# Patient Record
Sex: Male | Born: 1935 | Race: White | Hispanic: No | Marital: Married | Smoking: Never smoker
Health system: Southern US, Community
[De-identification: ages and names within clinical notes are randomized; demographics above are authoritative.]

## PROBLEM LIST (undated history)

## (undated) DIAGNOSIS — E039 Hypothyroidism, unspecified: Secondary | ICD-10-CM

## (undated) DIAGNOSIS — E785 Hyperlipidemia, unspecified: Secondary | ICD-10-CM

## (undated) DIAGNOSIS — R634 Abnormal weight loss: Secondary | ICD-10-CM

## (undated) DIAGNOSIS — N189 Chronic kidney disease, unspecified: Secondary | ICD-10-CM

## (undated) DIAGNOSIS — I2510 Atherosclerotic heart disease of native coronary artery without angina pectoris: Secondary | ICD-10-CM

## (undated) DIAGNOSIS — Z960 Presence of urogenital implants: Secondary | ICD-10-CM

## (undated) DIAGNOSIS — C801 Malignant (primary) neoplasm, unspecified: Secondary | ICD-10-CM

## (undated) DIAGNOSIS — N400 Enlarged prostate without lower urinary tract symptoms: Secondary | ICD-10-CM

## (undated) DIAGNOSIS — E079 Disorder of thyroid, unspecified: Secondary | ICD-10-CM

## (undated) DIAGNOSIS — I10 Essential (primary) hypertension: Secondary | ICD-10-CM

## (undated) DIAGNOSIS — M1990 Unspecified osteoarthritis, unspecified site: Secondary | ICD-10-CM

## (undated) HISTORY — PX: CORONARY ARTERY BYPASS GRAFT: SHX141

## (undated) HISTORY — PX: OTHER SURGICAL HISTORY: SHX169

## (undated) HISTORY — DX: Benign prostatic hyperplasia without lower urinary tract symptoms: N40.0

## (undated) HISTORY — DX: Hyperlipidemia, unspecified: E78.5

## (undated) HISTORY — DX: Atherosclerotic heart disease of native coronary artery without angina pectoris: I25.10

## (undated) HISTORY — DX: Disorder of thyroid, unspecified: E07.9

---

## 1997-08-20 ENCOUNTER — Emergency Department (HOSPITAL_COMMUNITY): Admission: EM | Admit: 1997-08-20 | Discharge: 1997-08-20 | Payer: Self-pay

## 1998-12-11 ENCOUNTER — Ambulatory Visit (HOSPITAL_COMMUNITY): Admission: RE | Admit: 1998-12-11 | Discharge: 1998-12-11 | Payer: Self-pay | Admitting: Gastroenterology

## 1998-12-11 ENCOUNTER — Encounter (INDEPENDENT_AMBULATORY_CARE_PROVIDER_SITE_OTHER): Payer: Self-pay | Admitting: Specialist

## 2004-10-24 ENCOUNTER — Ambulatory Visit: Payer: Self-pay | Admitting: *Deleted

## 2005-10-30 ENCOUNTER — Ambulatory Visit: Payer: Self-pay | Admitting: *Deleted

## 2005-11-11 ENCOUNTER — Ambulatory Visit: Payer: Self-pay

## 2006-12-03 ENCOUNTER — Ambulatory Visit: Payer: Self-pay | Admitting: Cardiology

## 2007-11-27 ENCOUNTER — Ambulatory Visit: Payer: Self-pay | Admitting: Cardiology

## 2008-09-28 ENCOUNTER — Encounter (INDEPENDENT_AMBULATORY_CARE_PROVIDER_SITE_OTHER): Payer: Self-pay | Admitting: *Deleted

## 2008-12-01 DIAGNOSIS — I2510 Atherosclerotic heart disease of native coronary artery without angina pectoris: Secondary | ICD-10-CM | POA: Insufficient documentation

## 2008-12-01 DIAGNOSIS — E039 Hypothyroidism, unspecified: Secondary | ICD-10-CM | POA: Insufficient documentation

## 2008-12-01 DIAGNOSIS — E785 Hyperlipidemia, unspecified: Secondary | ICD-10-CM

## 2008-12-06 ENCOUNTER — Ambulatory Visit: Payer: Self-pay | Admitting: Cardiology

## 2008-12-06 DIAGNOSIS — I10 Essential (primary) hypertension: Secondary | ICD-10-CM

## 2008-12-13 ENCOUNTER — Telehealth (INDEPENDENT_AMBULATORY_CARE_PROVIDER_SITE_OTHER): Payer: Self-pay | Admitting: *Deleted

## 2008-12-14 ENCOUNTER — Ambulatory Visit: Payer: Self-pay | Admitting: Cardiovascular Disease

## 2008-12-14 ENCOUNTER — Ambulatory Visit: Payer: Self-pay

## 2008-12-14 ENCOUNTER — Encounter (HOSPITAL_COMMUNITY): Admission: RE | Admit: 2008-12-14 | Discharge: 2009-02-08 | Payer: Self-pay | Admitting: Cardiology

## 2008-12-20 ENCOUNTER — Encounter: Admission: RE | Admit: 2008-12-20 | Discharge: 2008-12-20 | Payer: Self-pay | Admitting: Endocrinology

## 2009-02-28 ENCOUNTER — Emergency Department (HOSPITAL_COMMUNITY): Admission: EM | Admit: 2009-02-28 | Discharge: 2009-02-28 | Payer: Self-pay | Admitting: Emergency Medicine

## 2009-03-14 ENCOUNTER — Encounter: Admission: RE | Admit: 2009-03-14 | Discharge: 2009-03-14 | Payer: Self-pay | Admitting: Orthopedic Surgery

## 2009-04-07 ENCOUNTER — Telehealth: Payer: Self-pay | Admitting: Cardiovascular Disease

## 2009-04-11 ENCOUNTER — Telehealth (INDEPENDENT_AMBULATORY_CARE_PROVIDER_SITE_OTHER): Payer: Self-pay | Admitting: *Deleted

## 2009-05-31 ENCOUNTER — Encounter: Admission: RE | Admit: 2009-05-31 | Discharge: 2009-05-31 | Payer: Self-pay | Admitting: Endocrinology

## 2009-12-15 ENCOUNTER — Encounter: Payer: Self-pay | Admitting: Cardiology

## 2009-12-19 ENCOUNTER — Ambulatory Visit: Payer: Self-pay | Admitting: Cardiology

## 2009-12-27 ENCOUNTER — Encounter: Admission: RE | Admit: 2009-12-27 | Discharge: 2009-12-27 | Payer: Self-pay | Admitting: Endocrinology

## 2009-12-27 ENCOUNTER — Telehealth (INDEPENDENT_AMBULATORY_CARE_PROVIDER_SITE_OTHER): Payer: Self-pay | Admitting: *Deleted

## 2010-03-07 ENCOUNTER — Encounter
Admission: RE | Admit: 2010-03-07 | Discharge: 2010-03-07 | Payer: Self-pay | Source: Home / Self Care | Attending: Neurological Surgery | Admitting: Neurological Surgery

## 2010-03-15 NOTE — Progress Notes (Signed)
Summary: ASA   Phone Note Call from Patient Call back at Home Phone [phone]   Caller: Patient Reason for Call: Talk to Nurse Summary of Call: [facility] is taken pt off of ASA for 5 days for steroid injection in hip Initial call taken by: Migdalia Dk,  April 07, 2009 4:46 PM  Follow-up for Phone Call        spoke with pt, he is going to have steroid injection on tues or thurs next week and is off his asa for now. told pt he should restart asa the day he gets the injection. pt voiced understanding Deliah Goody, RN  April 07, 2009 4:52 PM

## 2010-03-15 NOTE — Progress Notes (Signed)
Summary: question regarding asa-injection   Phone Note Call from Patient Call back at Home Phone [phone]   Caller: Patient Reason for Call: Talk to Nurse Details for Reason:  getting injection when 3/3. question regarding asa. Initial call taken by: Lorne Skeens,  April 11, 2009 2:35 PM  Follow-up for Phone Call        Pt needed to know when to restart ASA.  Instructed him to restart after his procedure 3/3.  He states understanding. Follow-up by: Charolotte Capuchin, RN,  April 11, 2009 2:56 PM

## 2010-03-15 NOTE — Assessment & Plan Note (Signed)
Summary: yearly/sl      Allergies Added: NKDA  Visit Type:  Follow-up Primary Provider:  Hali Marry, MD  CC:  CAD.  History of Present Illness: The patient returns for followup of this. Since I last saw him he has had no new cardiovascular complaints. He did have an injury of his back and had this treated with injections. This has limited his activity to some degree. Weight is activities he denies any chest pressure, neck or arm discomfort. He is not having any new shortness of breath, PND or orthopnea. He has had no palpitations, presyncope or syncope.  Current Medications (verified): 1)  Flomax 0.4 Mg Caps (Tamsulosin Hcl) .... Daily 2)  Synthroid 100 Mcg Tabs (Levothyroxine Sodium) .... Daily 3)  Lipitor 10 Mg Tabs (Atorvastatin Calcium) .... Daily 4)  Altace 10 Mg Caps (Ramipril) .Marland Kitchen.. 1 By Mouth Daiyl 5)  Atenolol 25 Mg Tabs (Atenolol) .... Daily 6)  Aspirin 81 Mg  Tabs (Aspirin) .... 2 Daily 7)  Multivitamins   Tabs (Multiple Vitamin) .... Daily  Allergies (verified): No Known Drug Allergies  Past History:  Past Medical History: Reviewed history from 12/01/2008 and no changes required.  1. Coronary artery disease (status post CABG in 1996 by Dr. Donata Clay       with LIMA to the LAD, SVG to D1 and D2, SVG to circumflex, SVG to       right coronary artery).   2. Left inguinal hernia.   3. Hypothyroidism.   4. Dyslipidemia.   Past Surgical History: Reviewed history from 12/01/2008 and no changes required. None  Review of Systems       As stated in the HPI and negative for all other systems.   Vital Signs:  Patient profile:   77 year old male Height:      68 inches Weight:      166 pounds BMI:     25.33 Pulse rate:   85 / minute Resp:     16 per minute BP sitting:   114 / 64  (left arm)  Vitals Entered By: Marrion Coy, CNA (December 19, 2009 4:30 PM)  Physical Exam  General:  Well developed, well nourished, in no acute distress. Head:   normocephalic and atraumatic Mouth:  Teeth, gums and palate normal. Oral mucosa normal. Neck:  Neck supple, no JVD. No masses, thyromegaly or abnormal cervical nodes. Chest Wall:  no deformities or breast masses noted Lungs:  Clear bilaterally to auscultation and percussion. Abdomen:  Bowel sounds positive; abdomen soft and non-tender without masses, organomegaly, or hernias noted. No hepatosplenomegaly. Msk:  Back normal, normal gait. Muscle strength and tone normal. Extremities:  No clubbing or cyanosis. Neurologic:  Alert and oriented x 3. Skin:  Intact without lesions or rashes. Cervical Nodes:  no significant adenopathy Inguinal Nodes:  no significant adenopathy Psych:  Normal affect.   Detailed Cardiovascular Exam  Neck    Carotids: Carotids full and equal bilaterally without bruits.      Neck Veins: Normal, no JVD.    Heart    Inspection: no deformities or lifts noted.      Palpation: normal PMI with no thrills palpable.      Auscultation: regular rate and rhythm, S1, S2 without murmurs, rubs, gallops, or clicks.    Vascular    Abdominal Aorta: no palpable masses, pulsations, or audible bruits.      Femoral Pulses: normal femoral pulses bilaterally.      Pedal Pulses: normal pedal pulses bilaterally.  Radial Pulses: normal radial pulses bilaterally.      Peripheral Circulation: no clubbing, cyanosis, or edema noted with normal capillary refill.     EKG  Procedure date:  12/19/2009  Findings:      Sinus rhythm, rate 84, axis within normal limits, poor anterior R-wave progression, no acute ST-T wave changes  Impression & Recommendations:  Problem # 1:  CAD (ICD-414.00) He has had no new since his stress test last year. Further cardiovascular testing or change in therapy is suggested. Orders: EKG w/ Interpretation (93000)  Problem # 2:  DYSLIPIDEMIA (ICD-272.4) His lipids are followed closely by his primary physician. I will defer to his management. The goal  is LDL less than 100 no greater than 40.  Problem # 3:  ESSENTIAL HYPERTENSION, BENIGN (ICD-401.1) His blood pressure is controlled. We will continue the meds as listed.  Patient Instructions: 1)  Your physician recommends that you schedule a follow-up appointment in: 12 months with Dr Antoine Poche 2)  Your physician recommends that you continue on your current medications as directed. Please refer to the Current Medication list given to you today.

## 2010-03-15 NOTE — Miscellaneous (Signed)
  Clinical Lists Changes  Observations: Added new observation of NUCLEAR NOS: Exercise Capacity: Poor exercise capacity. BP Response: Hypertensive blood pressure response. Clinical Symptoms: No chest pain ECG Impression: No significant ST segment change suggestive of ischemia. Overall Impression: Basal lateral wall infarct with mild to moderate peri-infarct ischemia Overall Impression Comments: Basal lateral wall infarct with mild to moderate peri-infarct ischemia involving the mid and basal inferolateral wall (12/14/2008 10:50)      Nuclear Study  Procedure date:  12/14/2008  Findings:      Exercise Capacity: Poor exercise capacity. BP Response: Hypertensive blood pressure response. Clinical Symptoms: No chest pain ECG Impression: No significant ST segment change suggestive of ischemia. Overall Impression: Basal lateral wall infarct with mild to moderate peri-infarct ischemia Overall Impression Comments: Basal lateral wall infarct with mild to moderate peri-infarct ischemia involving the mid and basal inferolateral wall

## 2010-03-15 NOTE — Progress Notes (Signed)
Summary: Records Request   Faxed EKG to [facility] at [facility] ([phone]). Debby Freiberg  December 27, 2009 3:18 PM

## 2010-04-30 LAB — URINALYSIS, ROUTINE W REFLEX MICROSCOPIC
Hgb urine dipstick: NEGATIVE
Nitrite: NEGATIVE
Specific Gravity, Urine: 1.027 (ref 1.005–1.030)
Urobilinogen, UA: 0.2 mg/dL (ref 0.0–1.0)

## 2010-04-30 LAB — URINE CULTURE

## 2010-06-26 NOTE — Assessment & Plan Note (Signed)
[facility]                            CARDIOLOGY OFFICE NOTE   James Small, James Small                     MRN:          [id number]  DATE:12/03/2006                            DOB:          1922-05-13    PRIMARY:  Dr. Leslie Dales.   REASON FOR PRESENTATION:  Evaluate the patient with coronary artery  disease.   HISTORY OF PRESENT ILLNESS:  The patient is 77 years old.  Previously  followed by Dr. Corinda Gubler.  He has a history of coronary artery disease  with bypass in 1996, as described below.  He has been followed with  physical examinations apparently yearly.  He did have a stress perfusion  study last year, which demonstrated old inferolateral infarct with an EF  of 53%.  He has continued to be managed medically.   The patient is back for a yearly followup.  He does relatively well.  He  gets on his treadmill 2 times a week.  He has to take care of his wife  who has memory problems.  This adds quite a bit of stress and time  requirement to his life.  With his exercise, he denies any chest  discomfort, neck or arm discomfort.  He has not had any palpitations,  pre-syncope, or syncope.  He has had no PND or orthopnea.   PAST MEDICAL HISTORY:  Coronary artery disease (status post CABG in 1996  by Dr. Donata Clay with a LIMA to the LAD, SVG to D1 and D2, SVG to  circumflex, SVG to the right coronary artery).  Left inguinal hernia  repair.  Hypothyroidism.  Hyperlipidemia.   ALLERGIES:  None.   CURRENT MEDICATIONS:  1. Altace 10 mg daily.  2. Atenolol 25 mg daily.  3. Lipitor 10 mg daily.  4. Aspirin 162 mg daily.  5. Multivitamin.  6. Vitamin D.  7. Synthroid 8 mcg daily.  8. Zyrtec.   REVIEW OF SYSTEMS:  As stated in the HPI and otherwise negative for  other systems.   PHYSICAL EXAMINATION:  The patient is in no distress.  Blood pressure 120/78, heart rate 60 and regular, weight 170 pounds,  body mass index 25.  HEENT:  Eyelids unremarkable.   Pupils are equal, round, and reactive to  light and accommodation.  Fundi are not well visualized.  Oral mucosa  unremarkable.  NECK:  No jugular venous distension at 45 degrees, carotid upstroke  brisk and symmetric, no bruits, thyromegaly.  LYMPHATICS:  No cervical, axillary, or inguinal adenopathy.  LUNGS:  Clear to auscultation bilaterally.  BACK:  No costovertebral angle tenderness.  CHEST:  Well-healed sternotomy scar.  HEART:  PMI not displaced or sustained, S1 and S2 within normal limits,  no S3, no S4, no clicks, rubs, murmurs.  ABDOMEN:  Flat, positive bowel sounds, normal in frequency and pitch, no  bruits, rebound, guarding.  No midline pulsatile masses, hepatomegaly,  splenomegaly.  SKIN:  No rashes, no nodules.  EXTREMITIES:  With 2+ pulses throughout, no edema, cyanosis, clubbing.  NEURO:  Oriented to person, place, and time, cranial nerves 2-12 grossly  intact, motor grossly  intact.   EKG (done by his primary care physician), sinus rhythm with a short PR  interval, left atrial enlargement, left axis deviation, early transition  lead V2, nonspecific T wave flattening.   ASSESSMENT AND PLAN:  1. Coronary artery disease.  The patient has bypass grafts that are      now 75 years old.  However, he had a stress perfusion study last      year with no active ischemia.  He has had no new symptoms.  At this      point, he will continue with aggressive risk reduction.  I do think      that the one element missing is more routine exercise.  This may be      difficult with his social situation, but he does have a treadmill      in the house.  I have asked him to get on this 5 days a week 20      minutes at a time.  He thinks he can comply with this.  2. Dyslipidemia.  He had an excellent lipid profile, followed by Dr.      Leslie Dales, and I did review this.  3. Hypertension.  Blood pressure is well controlled and he will      continue with the medications as listed.  4.  Hypothyroidism.  Per Dr. Leslie Dales.  5. Followup.  I will see him back yearly.     Rollene Rotunda, MD, [facility]  Electronically Signed    JH/MedQ  DD: 12/03/2006  DT: 12/04/2006  Job #: [id number]   cc:   Veverly Fells. Altheimer, M.D.

## 2010-06-26 NOTE — Assessment & Plan Note (Signed)
[facility]                            CARDIOLOGY OFFICE NOTE   James Small, James Small                     MRN:          [id number]  DATE:11/27/2007                            DOB:          Aug 16, 1922    PRIMARY CARE PHYSICIAN:  Veverly Fells. Altheimer, MD   REASON FOR PRESENTATION:  Evaluate patient with coronary disease.   HISTORY OF PRESENT ILLNESS:  The patient is an 77 year old white  gentleman with coronary disease as described below.  Presents for yearly  followup.  Since I saw him last, he has done well.  He has had no chest  discomfort, neck or arm discomfort.  He has had no palpitations,  presyncope, or syncope.  He denies any PND or orthopnea.  He is not  exercising as much as he used to though he still occasionally gets on  the treadmill.  He is having a lot of difficulty caring for his wife at  home whose memory is failing.   PAST MEDICAL HISTORY:  1. Coronary artery disease (status post CABG in 1996 by Dr. Donata Clay      with LIMA to the LAD, SVG to D1 and D2, SVG to circumflex, SVG to      right coronary artery).  2. Left inguinal hernia.  3. Hypothyroidism.  4. Dyslipidemia.   ALLERGIES:  None.   MEDICATIONS:  1. Altace 10 mg daily.  2. Atenolol 25 mg daily.  3. Lipitor 10 mg daily.  4. Aspirin 162 mg daily.  5. Multivitamin.  6. Vitamin D.  7. Synthroid 8 mcg daily.  8. Zyrtec.  9. Flomax.   REVIEW OF SYSTEMS:  As stated in the HPI and otherwise negative for  other systems.   PHYSICAL EXAMINATION:  GENERAL:  The patient is in no distress.  VITAL SIGNS:  Blood pressure 146/83, heart rate 86 and regular, weight  174 pounds.  HEENT:  Eyelids unremarkable.  Pupils equal, round, and reactive to  light.  Fundi not visualized.  Oral mucosa unremarkable.  NECK:  No jugular venous distention at 45 degrees.  Carotid upstroke  brisk and symmetric.  No bruits, no thyromegaly.  LYMPHATICS:  No cervical, axillary, or inguinal  adenopathy.  LUNGS:  Clear to auscultation bilaterally.  BACK:  No costovertebral angle tenderness.  CHEST:  Unremarkable.  HEART:  PMI not displaced or sustained.  S1 and S2 within normal limits.  No S3, no S4, no clicks, no rubs, no murmurs.  ABDOMEN:  Flat.  Positive bowel sounds normal in frequency and pitch.  No bruits, no rebound, no guarding, no midline pulsatile mass, no  hepatomegaly, no splenomegaly.  SKIN:  No rashes, no nodules.  EXTREMITIES:  2+ pulse, no edema.   EKG, sinus rhythm, rate 86, axis within normal limits, intervals within  normal limits, probable old inferior infarct, nonspecific lateral T-wave  and ST changes.   ASSESSMENT/PLAN:  1. Coronary disease.  The patient is having no new symptoms.  No      further cardiovascular testing is suggested.  He will continue with      the risk reduction.  We discussed about the possibility of doing a      stress test as he has bypass grafts that are now 75 years old.      However, he is not symptomatic and he had a stress test in 2007.      We will defer this until next year or sooner if he has any      symptoms.  2. Dyslipidemia.  He is having his lipids followed by Dr. Leslie Dales      and I did get to review this.  His total was 115, triglycerides      102, HDL 39, and LDL 73.  I think this is acceptable and he will      remain on the Lipitor.  3. Hypertension.  Blood pressure is controlled.  He will continue the      medicines as listed.  4. Followup.  I will see the patient back in 1 year or sooner if      needed.     Rollene Rotunda, MD, [facility]  Electronically Signed    JH/MedQ  DD: 11/27/2007  DT: 11/28/2007  Job #: [id number]   cc:   Veverly Fells. Altheimer, M.D.

## 2010-06-29 NOTE — Letter (Signed)
October 30, 2005     Hali Marry, MD  [address], [address]  [place], [place]  [address]   RE:  James, Small  MRN:  [id number]  /  DOB:  [birth year]   Dear Casimiro Needle:   It was a pleasure to see your nice patient, James Small, for follow-up on  October 30, 2005.   James Small is a very pleasant 77 year old white married male who is 11 years  postoperative coronary artery bypass graft by Dr. Donata Clay.  The patient  has remained asymptomatic.  He has a history of hyperlipidemia and  hypothyroidism.  He has no cardiac symptoms.   The patient's medications include B6, Altace 10 mg, atenolol 25 mg, Lipitor  10 mg, Synthroid 10 mcg, aspirin 325 mg daily.   The patient notes that he recently found out that he has a left inguinal  hernia and he is to get a surgical consultation in the near future.  He is  quite apprehensive about the possibility of surgery.   Physical examination shows blood pressure 136/66 and pulse of 76, normal  sinus rhythm.  The general appearance is normal.  Jugular venous pressure is  not elevated.  Carotid pulses are palpable and without bruits.  The lungs  are clear.  The cardiac examination is normal.  The abdominal examination is  normal, except for a left inguinal hernia.  The extremities reveal no edema.   The electrocardiogram performed on October 10, 2005 revealed mild,  nonspecific ST changes.  This is unchanged from previous electrocardiogram.   IMPRESSION:  1. Coronary artery disease, asymptomatic, 11 years post coronary artery      bypass graft.  2. Hypertension, controlled.  3. Hyperlipidemia, controlled.  4. Left inguinal hernia.  5. Hypothyroidism.   I have suggested a stress Myoview.   Thank you for the opportunity to share in this nice gentleman's care.  I  would be happy to see him again in a year or anytime you so desire.    With Best Regards,      Cecil Cranker, MD, [facility]   EJL/MedQ  DD:   10/30/2005  DT:  11/01/2005  Job #:  [id number]

## 2010-09-19 ENCOUNTER — Other Ambulatory Visit: Payer: Self-pay | Admitting: *Deleted

## 2010-09-19 MED ORDER — RAMIPRIL 10 MG PO CAPS: 10.0000 mg | ORAL_CAPSULE | Freq: Every day | ORAL | Status: DC

## 2010-12-21 ENCOUNTER — Encounter: Payer: Self-pay | Admitting: Cardiology

## 2010-12-24 ENCOUNTER — Encounter: Payer: Self-pay | Admitting: Cardiology

## 2010-12-24 ENCOUNTER — Ambulatory Visit (INDEPENDENT_AMBULATORY_CARE_PROVIDER_SITE_OTHER): Payer: Medicare Other | Admitting: Cardiology

## 2010-12-24 DIAGNOSIS — E785 Hyperlipidemia, unspecified: Secondary | ICD-10-CM

## 2010-12-24 DIAGNOSIS — I10 Essential (primary) hypertension: Secondary | ICD-10-CM

## 2010-12-24 DIAGNOSIS — I2510 Atherosclerotic heart disease of native coronary artery without angina pectoris: Secondary | ICD-10-CM

## 2010-12-24 NOTE — Assessment & Plan Note (Signed)
This is followed by Junious Silk, MD .  I will defer to his management.

## 2010-12-24 NOTE — Assessment & Plan Note (Signed)
The blood pressure is at target. No change in medications is indicated. We will continue with therapeutic lifestyle changes (TLC).  

## 2010-12-24 NOTE — Patient Instructions (Signed)
The current medical regimen is effective;  continue present plan and medications.  Follow up in 1 year with Dr Hochrein.  You will receive a letter in the mail 2 months before you are due.  Please call us when you receive this letter to schedule your follow up appointment.  

## 2010-12-24 NOTE — Progress Notes (Signed)
HPI The patient presents for one year follow up.  Since I last saw him he has done well.  The patient denies any new symptoms such as chest discomfort, neck or arm discomfort. There has been no new shortness of breath, PND or orthopnea. There have been no reported palpitations, presyncope or syncope.  He still gets around but he is limited because he has to care for his wife who has progressive dementia.  No Known Allergies  Current Outpatient Prescriptions  Medication Sig Dispense Refill  . aspirin 81 MG tablet Take 81 mg by mouth daily.        Marland Kitchen atenolol (TENORMIN) 25 MG tablet Take 25 mg by mouth daily.        Marland Kitchen atorvastatin (LIPITOR) 10 MG tablet Take 10 mg by mouth daily.        . finasteride (PROSCAR) 5 MG tablet Take 5 mg by mouth daily.        Marland Kitchen levothyroxine (SYNTHROID) 100 MCG tablet Take 100 mcg by mouth daily.        . Multiple Vitamins-Minerals (MULTI COMPLETE PO) Take by mouth.        . ramipril (ALTACE) 10 MG capsule Take 1 capsule (10 mg total) by mouth daily.  30 capsule  4  . silodosin (RAPAFLO) 4 MG CAPS capsule Take 4 mg by mouth daily with breakfast.          Past Medical History  Diagnosis Date  . Coronary artery disease     status post cabg in 1996 by Donata Clay with LIMA to the LAD, SVG to D1 and D2, SVG to circumflex, SVG to right coronary artery  . Inguinal hernia     left  . Thyroid disease   . Dyslipidemia   . BPH (benign prostatic hyperplasia)     Past Surgical History  Procedure Date  . Coronary artery bypass graft     1996    ROS:  As stated in the HPI and negative for all other systems.  PHYSICAL EXAM BP 108/59  Pulse 74  Ht 5\' 7"  (1.702 m)  Wt 153 lb (69.4 kg)  BMI 23.96 kg/m2 GENERAL:  Well appearing, but frail HEENT:  Pupils equal round and reactive, fundi not visualized, oral mucosa unremarkable NECK:  No jugular venous distention, waveform within normal limits, carotid upstroke brisk and symmetric, no bruits, no thyromegaly LYMPHATICS:   No cervical, inguinal adenopathy LUNGS:  Clear to auscultation bilaterally BACK:  No CVA tenderness CHEST:  Well healed sternotomy scar. HEART:  PMI not displaced or sustained,S1 and S2 within normal limits, no S3, no S4, no clicks, no rubs, no murmurs ABD:  Flat, positive bowel sounds normal in frequency in pitch, no bruits, no rebound, no guarding, no midline pulsatile mass, no hepatomegaly, no splenomegaly EXT:  2 plus pulses throughout, no edema, no cyanosis no clubbing SKIN:  No rashes no nodules NEURO:  Cranial nerves II through XII grossly intact, motor grossly intact throughout PSYCH:  Cognitively intact, oriented to person place and time  EKG:  Sinus rhythm, rate 74, axis within normal limits, intervals within normal limits, left atrial enlargement, nonspecific ST-T wave changes.  ASSESSMENT AND PLAN

## 2010-12-24 NOTE — Assessment & Plan Note (Signed)
He had a stress perfusion study in 2010 and no new symptoms since then.  He will continue with meds as listed.

## 2011-02-13 ENCOUNTER — Other Ambulatory Visit: Payer: Self-pay | Admitting: Cardiology

## 2011-07-14 ENCOUNTER — Other Ambulatory Visit: Payer: Self-pay | Admitting: Cardiology

## 2011-09-12 ENCOUNTER — Other Ambulatory Visit: Payer: Self-pay | Admitting: Cardiology

## 2011-09-12 NOTE — Telephone Encounter (Signed)
..   Requested Prescriptions   Pending Prescriptions Disp Refills  . atenolol (TENORMIN) 50 MG tablet [Pharmacy Med Name: ATENOLOL 50 MG TABLET] 30 tablet 2    Sig: TAKE 1/2 TABLET DAILY  Please call the office to make appointment for Nov 2013

## 2011-10-18 ENCOUNTER — Other Ambulatory Visit: Payer: Self-pay | Admitting: Urology

## 2011-10-24 ENCOUNTER — Telehealth: Payer: Self-pay | Admitting: Cardiology

## 2011-10-24 NOTE — Telephone Encounter (Signed)
Pt daughter is concerned that pt is losing weight and has decreased appetite.  Unsatisfied with results from PCP.  Scheduled for TURP on 11/04/2011.  Would you see pt for this reason and help determine etiology?

## 2011-10-24 NOTE — Telephone Encounter (Signed)
Pt has had a 25lb weight loss since October of last year, having bladder issues as well, would like to know what to do? pls call

## 2011-10-24 NOTE — Telephone Encounter (Signed)
I would be happy to talk to him about it.  Please schedule an appt.

## 2011-10-25 ENCOUNTER — Encounter (HOSPITAL_COMMUNITY): Payer: Self-pay | Admitting: Pharmacy Technician

## 2011-10-28 NOTE — Patient Instructions (Signed)
20 James Small  10/28/2011   Your procedure is scheduled on:  11/04/11 0730am-0830am  Report to [facility] at 0530 AM.  Call this number if you have problems the morning of surgery: [phone]   Remember:   Do not eat food:After Midnight.  May have clear liquids:until Midnight .  Marland Kitchen  Take these medicines the morning of surgery with A SIP OF WATER:   Do not wear jewelry,   Do not wear lotions, powders, or perfumes. .  . Men may shave face and neck.  Do not bring valuables to the hospital.  Contacts, dentures or bridgework may not be worn into surgery.  Leave suitcase in the car. After surgery it may be brought to your room.  For patients admitted to the hospital, checkout time is 11:00 AM the day of discharge.    Special Instructions: CHG Shower Use Special Wash: 1/2 bottle night before surgery and 1/2 bottle morning of surgery. shower chin to toes with CHG.  Wash face and private parts with regular soap.     Please read over the following fact sheets that you were given: MRSA Information, coughing and deep breathing exercises, leg exercises, Blood Transfusion Fact Sheet

## 2011-10-29 ENCOUNTER — Encounter (HOSPITAL_COMMUNITY): Payer: Self-pay

## 2011-10-29 ENCOUNTER — Ambulatory Visit (HOSPITAL_COMMUNITY)
Admission: RE | Admit: 2011-10-29 | Discharge: 2011-10-29 | Disposition: A | Discharge status: Home / Self Care | Payer: Medicare Other | Source: Ambulatory Visit | Attending: Urology | Admitting: Urology

## 2011-10-29 ENCOUNTER — Encounter (HOSPITAL_COMMUNITY)
Admission: RE | Admit: 2011-10-29 | Discharge: 2011-10-29 | Disposition: A | Discharge status: Home / Self Care | Payer: Medicare Other | Source: Ambulatory Visit | Attending: Urology | Admitting: Urology

## 2011-10-29 DIAGNOSIS — Z01812 Encounter for preprocedural laboratory examination: Secondary | ICD-10-CM | POA: Insufficient documentation

## 2011-10-29 DIAGNOSIS — N138 Other obstructive and reflux uropathy: Secondary | ICD-10-CM | POA: Insufficient documentation

## 2011-10-29 DIAGNOSIS — R339 Retention of urine, unspecified: Secondary | ICD-10-CM | POA: Insufficient documentation

## 2011-10-29 DIAGNOSIS — N401 Enlarged prostate with lower urinary tract symptoms: Secondary | ICD-10-CM | POA: Insufficient documentation

## 2011-10-29 DIAGNOSIS — E039 Hypothyroidism, unspecified: Secondary | ICD-10-CM | POA: Insufficient documentation

## 2011-10-29 DIAGNOSIS — I2510 Atherosclerotic heart disease of native coronary artery without angina pectoris: Secondary | ICD-10-CM | POA: Insufficient documentation

## 2011-10-29 DIAGNOSIS — Z951 Presence of aortocoronary bypass graft: Secondary | ICD-10-CM | POA: Insufficient documentation

## 2011-10-29 DIAGNOSIS — N289 Disorder of kidney and ureter, unspecified: Secondary | ICD-10-CM | POA: Insufficient documentation

## 2011-10-29 DIAGNOSIS — I10 Essential (primary) hypertension: Secondary | ICD-10-CM | POA: Insufficient documentation

## 2011-10-29 HISTORY — DX: Abnormal weight loss: R63.4

## 2011-10-29 HISTORY — DX: Essential (primary) hypertension: I10

## 2011-10-29 HISTORY — DX: Unspecified osteoarthritis, unspecified site: M19.90

## 2011-10-29 HISTORY — DX: Hypothyroidism, unspecified: E03.9

## 2011-10-29 HISTORY — DX: Presence of urogenital implants: Z96.0

## 2011-10-29 HISTORY — DX: Chronic kidney disease, unspecified: N18.9

## 2011-10-29 LAB — SURGICAL PCR SCREEN: Staphylococcus aureus: POSITIVE — AB

## 2011-10-29 NOTE — Progress Notes (Signed)
Son and daughter present at preop visit today with patient.  Patient and family had gone to [facility] which they thought appointment was.  Patient stated he only wants to have suprapubic catheter placement not the TURP.  Daughter and son stated this also.  I instructed both patient and family to contact office of Dr Isabel Caprice and speak with nurse and OR scheduler regarding this change of plan.  Patient , son and daughter voiced understanding.  They will contact.

## 2011-10-29 NOTE — Progress Notes (Signed)
Last office visit with Dr Antoine Poche on 12/24/10 - [facility]  EKG- 12/24/10 [facility]

## 2011-10-29 NOTE — Progress Notes (Signed)
Patient stated labs done at office of Dr Altheimer on 10/28/11.  Called office of Dr Altheimer at [phone] and asked what labs were done and asked them to fax.  Office stated CBC, CMP and Iron Panel, TSH and T4 done.  They will fax to presurgical testing at [facility]. Pt is to see Dr Altheimer on followup on 10/30/11.

## 2011-10-29 NOTE — Progress Notes (Signed)
Labs received from office of Dr Alteheimer done on 10/28/11.  Lab results faxed and confirmation received to office of Dr Isabel Caprice.  Also called and left message on voice mail of Yates Decamp regarding abnormal lab results had been faxed that were done at office of Dr Altheimer on pt on 10/28/11.

## 2011-10-30 NOTE — Telephone Encounter (Signed)
Pt is scheduled to see PCP today 

## 2011-10-31 ENCOUNTER — Other Ambulatory Visit: Payer: Self-pay | Admitting: Urology

## 2011-10-31 NOTE — Progress Notes (Signed)
Called office of Y[facility] Decamp- surgery scheduler of Dr Isabel Caprice and informed her regarding patient statement about not having TURP only having the suprapubicl tube placement.  Left a message.  Per patient and daughter on the 17th of September patient they were supposed to call the office and speak with Dr Isabel Caprice regarding this.

## 2011-11-01 NOTE — H&P (Signed)
Patient presents today for outpatient surgery for placement of a suprapubic tube. He is elected not to undergo TURP of his prostate.   James Small returns today along with his daughter. His very complicated history is summarized below. He clearly has substantial obstructive uropathy and developed acute renal failure. His last renal function study did show some improvement, but he is due to have that reassessed. He has had ongoing urinary retention. We suggested that he have video urodynamics. We felt a lot of this might indeed be bladder dysfunction. At the time of urodynamics the patient was noted to have some loss of compliance with reduced functional capacity, poor sensation. He did have instability. He was able to void very little, so it was difficult to get a real true pressure flow study. Most of his voiding seemed to occur on unstable contracture.  Again, at this point it is hard to know how much of this is obstruction versus bladder dysfunction/hypocontractility and I suspect it is a combination.    History of Present Illness     78 y/o white male with h/o BPH with BOO presents for continued evaluation of declining renal function. The patient has had long-standing voiding issues. He has been documented to have an elevated post void residual. His renal function however up until recently has been fairly stable and he had a creatinine of 1.2 in January 2013. The patient contacted [place] in May of this year with complaints of nocturnal enuresis. We felt this was likely to be an overflow incontinence situation and strongly recommended that he consider intermittent catheterization at least prior to bedtime and potentially first thing in the morning. The patient did not want to proceed with that recommendation. The patient was recently noted to have an elevated creatinine of 3.7. We suspect this is likely to be secondary outlet obstruction with hydronephrosis.  He clearly has had an overflow situation.  Renal Ultrasound was performed 09/19/11. Renal measurements and complete findings are recorded on the worksheet which was reviewed. Images were also assessed. The patient clearly has substantial bilateral hydronephrosis with dilated ureters and a markedly distended bladder. Postvoid residual close to 500 cc. There is also a question of a stone in the left renal pelvis.  Interval Hx: Foley catheter was placed at his last office visit 09/19/11.  He has had severe penile pain at the meatus since time of catheter placement.  He returns today for further assessment with repeat renal U/S and repeat BMP.  He reports that the foley catheter has continued to drain appropriately.  He denies gross hematuria, flank pain, fever or chills. He continues to feel weak and fatigued.   Past Medical History Problems  1. History of  Acute Myocardial Infarction V12.59 2. History of  Cataract 366.9 3. History of  Heart Disease 429.9 4. History of  Hyperlipidemia 272.4 5. History of  Hypertension 401.9  Surgical History Problems  1. History of  CABG (CABG) V45.81 2. History of  Neck Surgery  Current Meds 1. Atenolol 50 MG Oral Tablet; TAKE 1 TABLET DAILY; Therapy: (Recorded:26Oct2012) to 2. Finasteride 5 MG Oral Tablet; Take 1 tablet every day; Therapy: 26Oct2012 to  (Evaluate:26Oct2013)  Requested for: 26Oct2012; Last Rx:26Oct2012 3. Lipitor 10 MG Oral Tablet; 1 per day; Therapy: (Recorded:26Oct2012) to 4. Ramipril 10 MG Oral Capsule; 1 per day; Therapy: (Recorded:26Oct2012) to 5. Rapaflo 8 MG Oral Capsule; 1 per day; Therapy: (Recorded:26Oct2012) to 6. Synthroid 100 MCG Oral Tablet; TAKE 1 TABLET DAILY; Therapy: (Recorded:26Oct2012) to  Allergies  No Known Allergies  1. No Known Allergies  Family History Problems  1. Daughter's history of  Colon Cancer V16.0 2. Son's history of  Diabetes Mellitus V18.0 3. Family history of  Family Health Status Number Of Children 2: 1 son and 1 daughter 4. Family history of   Father Deceased At Age ____ 51: Heart Attack 5. Family history of  Mother Deceased At Age ____ 63: Unknown cause of death  Social History Problems  1. Never A Smoker 2. Occupation: Retired Denied  3. History of  Alcohol Use 4. History of  Caffeine Use  Review of Systems Genitourinary, constitutional, skin, eye, otolaryngeal, hematologic/lymphatic, cardiovascular, pulmonary, endocrine, musculoskeletal, gastrointestinal, neurological and psychiatric system(s) were reviewed and pertinent findings if present are noted.  Genitourinary: urinary frequency, nocturia, incontinence and weak urinary stream, but no hematuria.  Constitutional: feeling tired (fatigue).  Hematologic/Lymphatic: a tendency to easily bruise.  Musculoskeletal: back pain.    Vitals Vital Signs [Data Includes: Last 1 Day]  29Aug2013 02:44PM  Blood Pressure: 94 / 54 Temperature: 98 F Heart Rate: 99  Elderly well-developed well-nourished male in no acute distress Respiratory: Normal effort Abdomen: Soft nontender no palpable masses Genitourinary: Normal external genitalia with mild bilateral testicular atrophy. Extremities: Nontender no edema.  Assessment Assessed  1. Hydronephrosis 591 2. Benign Prostatic Hypertrophy With Urinary Obstruction 600.01 3. Incomplete Emptying Of Bladder 788.21  Plan  Incomplete Emptying Of Bladder (788.21)  1. Follow-up Schedule Surgery Office  Follow-up  Done: 29Aug2013  Discussion/Summary   James Small has obviously had some very longstanding outlet obstruction from benign prostatic hyperplasia. 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 We had talked to him in the past about intermittent catheterization. He subsequently did develop really significant overflow incontinence. We need to reassess BMET today to be sure  his renal function has improved. It is really unclear whether any treatment of his outlet obstruction will allow him to void effectively or not. I think that if he is interested, the treatment of choice would be an endoscopic assessment with a probable GYRUS TURP to eliminate obstructing tissue with concurrent placement of a suprapubic tube. We would plan on 1 and potentially 2 nights in the hospital. We would then plan on removal of his urethral catheter approximately 3 days after the procedure and then he can have a voiding trial. If he is able to void, he can measure postvoid residuals with his suprapubic tube. If he is unable to void, then he could continue to rely on the suprapubic tube a as better long-term strategy than an indwelling urethral catheter, at least from a comfort standpoint. If he is then able to void reasonably well and his postvoid residuals are acceptable, the suprapubic tube can be removed. Again, a BMET needs to be done today. He currently has some ongoing family issues going on, not only with his wife but with his son and so we will have to figure out when the appropriate time is for [place] to do this. In the interim he should stay on finasteride and Rapaflo. He will want to be started on a ciprofloxacin antibiotic a few days before the surgery and we will attempt to set things up.

## 2011-11-04 ENCOUNTER — Encounter (HOSPITAL_COMMUNITY): Payer: Self-pay | Admitting: Anesthesiology

## 2011-11-04 ENCOUNTER — Ambulatory Visit (HOSPITAL_COMMUNITY): Payer: Medicare Other | Admitting: Anesthesiology

## 2011-11-04 ENCOUNTER — Encounter (HOSPITAL_COMMUNITY): Payer: Self-pay | Admitting: *Deleted

## 2011-11-04 ENCOUNTER — Ambulatory Visit (HOSPITAL_COMMUNITY)
Admission: RE | Admit: 2011-11-04 | Discharge: 2011-11-04 | Disposition: A | Discharge status: Home / Self Care | Payer: Medicare Other | Source: Ambulatory Visit | Attending: Urology | Admitting: Urology

## 2011-11-04 ENCOUNTER — Encounter (HOSPITAL_COMMUNITY)
Admission: RE | Disposition: A | Discharge status: Home / Self Care | Payer: Self-pay | Source: Ambulatory Visit | Attending: Urology

## 2011-11-04 DIAGNOSIS — Z951 Presence of aortocoronary bypass graft: Secondary | ICD-10-CM | POA: Insufficient documentation

## 2011-11-04 DIAGNOSIS — I252 Old myocardial infarction: Secondary | ICD-10-CM | POA: Insufficient documentation

## 2011-11-04 DIAGNOSIS — E785 Hyperlipidemia, unspecified: Secondary | ICD-10-CM | POA: Insufficient documentation

## 2011-11-04 DIAGNOSIS — Z79899 Other long term (current) drug therapy: Secondary | ICD-10-CM | POA: Insufficient documentation

## 2011-11-04 DIAGNOSIS — N319 Neuromuscular dysfunction of bladder, unspecified: Secondary | ICD-10-CM | POA: Insufficient documentation

## 2011-11-04 DIAGNOSIS — I10 Essential (primary) hypertension: Secondary | ICD-10-CM | POA: Insufficient documentation

## 2011-11-04 DIAGNOSIS — N138 Other obstructive and reflux uropathy: Secondary | ICD-10-CM | POA: Insufficient documentation

## 2011-11-04 DIAGNOSIS — R339 Retention of urine, unspecified: Secondary | ICD-10-CM | POA: Insufficient documentation

## 2011-11-04 DIAGNOSIS — N401 Enlarged prostate with lower urinary tract symptoms: Secondary | ICD-10-CM | POA: Insufficient documentation

## 2011-11-04 LAB — TYPE AND SCREEN: ABO/RH(D): A POS

## 2011-11-04 SURGERY — INSERTION, SUPRAPUBIC CATHETER
Anesthesia: General | Wound class: Clean Contaminated

## 2011-11-04 MED ORDER — FENTANYL CITRATE 0.05 MG/ML IJ SOLN: 25.0000 ug | INTRAMUSCULAR | Status: DC | PRN

## 2011-11-04 MED ORDER — PROPOFOL 10 MG/ML IV BOLUS
INTRAVENOUS | Status: DC | PRN
  Administered 2011-11-04: 200 mg via INTRAVENOUS

## 2011-11-04 MED ORDER — LACTATED RINGERS IV SOLN
INTRAVENOUS | Status: DC | PRN
  Administered 2011-11-04 (×2): via INTRAVENOUS

## 2011-11-04 MED ORDER — PROMETHAZINE HCL 25 MG/ML IJ SOLN: 6.2500 mg | INTRAMUSCULAR | Status: DC | PRN

## 2011-11-04 MED ORDER — FENTANYL CITRATE 0.05 MG/ML IJ SOLN
INTRAMUSCULAR | Status: DC | PRN
  Administered 2011-11-04 (×2): 50 ug via INTRAVENOUS

## 2011-11-04 MED ORDER — EPHEDRINE SULFATE 50 MG/ML IJ SOLN
INTRAMUSCULAR | Status: DC | PRN
  Administered 2011-11-04: 5 mg via INTRAVENOUS

## 2011-11-04 MED ORDER — LIDOCAINE HCL (CARDIAC) 20 MG/ML IV SOLN
INTRAVENOUS | Status: DC | PRN
  Administered 2011-11-04: 50 mg via INTRAVENOUS

## 2011-11-04 MED ORDER — DEXTROSE 5 % IV SOLN
2.0000 g | INTRAVENOUS | Status: AC
  Administered 2011-11-04: 2 g via INTRAVENOUS
  Filled 2011-11-04: qty 2

## 2011-11-04 MED ORDER — HYDROCODONE-ACETAMINOPHEN 5-325 MG PO TABS: 1.0000 | ORAL_TABLET | Freq: Four times a day (QID) | ORAL | Status: DC | PRN

## 2011-11-04 MED ORDER — ONDANSETRON HCL 4 MG/2ML IJ SOLN
INTRAMUSCULAR | Status: DC | PRN
  Administered 2011-11-04: 4 mg via INTRAVENOUS

## 2011-11-04 MED ORDER — LACTATED RINGERS IV SOLN: INTRAVENOUS | Status: DC

## 2011-11-04 SURGICAL SUPPLY — 24 items
BAG URINE DRAINAGE (UROLOGICAL SUPPLIES) ×1 IMPLANT
BAG URINE LEG 500ML (DRAIN) ×2 IMPLANT
BLADE SURG 15 STRL LF DISP TIS (BLADE) ×1 IMPLANT
BLADE SURG 15 STRL SS (BLADE) ×2
CATH FOLEY 2WAY SLVR  5CC 20FR (CATHETERS) ×1
CATH FOLEY 2WAY SLVR 5CC 20FR (CATHETERS) IMPLANT
CLOTH BEACON ORANGE TIMEOUT ST (SAFETY) ×4 IMPLANT
COVER SURGICAL LIGHT HANDLE (MISCELLANEOUS) ×3 IMPLANT
ELECT REM PT RETURN 9FT ADLT (ELECTROSURGICAL) ×2
ELECTRODE REM PT RTRN 9FT ADLT (ELECTROSURGICAL) ×1 IMPLANT
GLOVE SURG SS PI 8.0 STRL IVOR (GLOVE) ×2 IMPLANT
GOWN STRL REIN XL XLG (GOWN DISPOSABLE) ×4 IMPLANT
KIT SUPRAPUBIC CATH (MISCELLANEOUS) ×1 IMPLANT
MANIFOLD NEPTUNE II (INSTRUMENTS) ×2 IMPLANT
NEEDLE HYPO 22GX1.5 SAFETY (NEEDLE) IMPLANT
NS IRRIG 1000ML POUR BTL (IV SOLUTION) ×2 IMPLANT
PACK CYSTO (CUSTOM PROCEDURE TRAY) ×2 IMPLANT
PENCIL BUTTON HOLSTER BLD 10FT (ELECTRODE) IMPLANT
PLUG CATH AND CAP STER (CATHETERS) IMPLANT
SUT ETHILON 3 0 FSL (SUTURE) ×1 IMPLANT
SUT SILK 2 0 30  PSL (SUTURE) ×1
SUT SILK 2 0 30 PSL (SUTURE) IMPLANT
TOWEL OR 17X26 10 PK STRL BLUE (TOWEL DISPOSABLE) ×2 IMPLANT
WATER STERILE IRR 3000ML UROMA (IV SOLUTION) ×2 IMPLANT

## 2011-11-04 NOTE — Anesthesia Preprocedure Evaluation (Signed)
Anesthesia Evaluation  Patient identified by MRN, date of birth, ID band Patient awake    Reviewed: Allergy & Precautions, H&P , NPO status , Patient's Chart, lab work & pertinent test results  Airway Mallampati: II TM Distance: >3 FB Neck ROM: Full    Dental  (+) Teeth Intact and Dental Advisory Given   Pulmonary neg pulmonary ROS,  breath sounds clear to auscultation  Pulmonary exam normal       Cardiovascular hypertension, Pt. on medications and Pt. on home beta blockers + CAD and + CABG Rhythm:Regular     Neuro/Psych negative neurological ROS  negative psych ROS   GI/Hepatic negative GI ROS, Neg liver ROS,   Endo/Other  Hypothyroidism   Renal/GU Renal InsufficiencyRenal disease  negative genitourinary   Musculoskeletal negative musculoskeletal ROS (+)   Abdominal   Peds negative pediatric ROS (+)  Hematology negative hematology ROS (+)   Anesthesia Other Findings   Reproductive/Obstetrics negative OB ROS                           Anesthesia Physical Anesthesia Plan  ASA: III  Anesthesia Plan: General   Post-op Pain Management:    Induction: Intravenous  Airway Management Planned: LMA  Additional Equipment:   Intra-op Plan:   Post-operative Plan: Extubation in OR  Informed Consent: I have reviewed the patients History and Physical, chart, labs and discussed the procedure including the risks, benefits and alternatives for the proposed anesthesia with the patient or authorized representative who has indicated his/her understanding and acceptance.   Dental advisory given  Plan Discussed with: CRNA  Anesthesia Plan Comments:         Anesthesia Quick Evaluation

## 2011-11-04 NOTE — Preoperative (Signed)
Beta Blockers   Reason not to administer Beta Blockers:Took Atenolol last pm. 

## 2011-11-04 NOTE — Op Note (Signed)
Preoperative diagnosis: Urinary retention with atonic neurogenic bladder Postoperative diagnosis: Same  Procedure: Cystoscopy with suprapubic tube insertion   Surgeon: Valetta Fuller M.D.  Anesthesia: Gen.  Indications: Patient is had long-standing voiding issues. He is currently 78 years of age. He presented recently with overflow incontinence. The patient was noted to have acute renal thigh with a creatinine of 3.7. Patient also had substantial bilateral hydronephrosis. A Foley catheter was inserted. Urodynamic studies suggested probably more bladder dysfunction/hypocontractility and obstruction but that it may be a combination of fact. We discussed TURP with suprapubic tube versus suprapubic tube alone given his age. He elected just to have placement of suprapubic tube for ongoing management of his discussed with him and his family.     Technique and findings: Patient was brought the operating room where he had successful induction general anesthesia. Placed in lithotomy position prepped and draped in usual manner. Appropriate surgical timeout was performed. Cystoscopy revealed moderate trilobar hyperplasia with a some prostatic asymmetry left lobe greater than right. Bladder showed some chronic edema and erythema consistent with the indwelling Foley catheter. A Lowsley prostatic retractor was utilized to tent up the anterior bladder wall. Her fever tube is in place in standard manner utilizing a 20 French catheter with a 5 cc balloon. Good positioning was confirmed with cystoscopy and this was secured to the anterior abdominal wall. The patient was brought to recovery room stable condition having had no obvious complications or problems.

## 2011-11-04 NOTE — Transfer of Care (Signed)
Immediate Anesthesia Transfer of Care Note  Patient: James Small  Procedure(s) Performed: Procedure(s) (LRB) with comments: INSERTION OF SUPRAPUBIC CATHETER (N/A)  Patient Location: PACU  Anesthesia Type: General  Level of Consciousness: awake, alert  and patient cooperative  Airway & Oxygen Therapy: Patient Spontanous Breathing and Patient connected to face mask oxygen  Post-op Assessment: Report given to PACU RN and Post -op Vital signs reviewed and stable  Post vital signs: Reviewed and stable  Complications: No apparent anesthesia complications

## 2011-11-04 NOTE — Interval H&P Note (Signed)
History and Physical Interval Note:  11/04/2011 7:34 AM  James Small  has presented today for surgery, with the diagnosis of ACUTE URINARY RETENTION, BENIGN PROSTATIC HYPERTROPHY  The various methods of treatment have been discussed with the patient and family. After consideration of risks, benefits and other options for treatment, the patient has consented to  Procedure(s) (LRB) with comments: INSERTION OF SUPRAPUBIC CATHETER (N/A) as a surgical intervention .  The patient's history has been reviewed, patient examined, no change in status, stable for surgery.  I have reviewed the patient's chart and labs.  Questions were answered to the patient's satisfaction.     Marilynn Ekstein S

## 2011-11-04 NOTE — Anesthesia Postprocedure Evaluation (Signed)
Anesthesia Post Note  Patient: James Small  Procedure(s) Performed: Procedure(s) (LRB): INSERTION OF SUPRAPUBIC CATHETER (N/A)  Anesthesia type: General  Patient location: PACU  Post pain: Pain level controlled  Post assessment: Post-op Vital signs reviewed  Last Vitals:  Filed Vitals:   11/04/11 0825  BP:   Pulse: 71  Temp: 36.6 C  Resp: 19    Post vital signs: Reviewed  Level of consciousness: sedated  Complications: No apparent anesthesia complications

## 2012-01-20 ENCOUNTER — Encounter: Payer: Self-pay | Admitting: Cardiology

## 2012-01-20 ENCOUNTER — Ambulatory Visit (INDEPENDENT_AMBULATORY_CARE_PROVIDER_SITE_OTHER): Payer: Medicare Other | Admitting: Cardiology

## 2012-01-20 VITALS — BP 110/55 | HR 80 | Ht 68.0 in | Wt 135.8 lb

## 2012-01-20 DIAGNOSIS — E785 Hyperlipidemia, unspecified: Secondary | ICD-10-CM

## 2012-01-20 DIAGNOSIS — I10 Essential (primary) hypertension: Secondary | ICD-10-CM

## 2012-01-20 DIAGNOSIS — E039 Hypothyroidism, unspecified: Secondary | ICD-10-CM

## 2012-01-20 DIAGNOSIS — I2510 Atherosclerotic heart disease of native coronary artery without angina pectoris: Secondary | ICD-10-CM

## 2012-01-20 NOTE — Patient Instructions (Addendum)
The current medical regimen is effective;  continue present plan and medications.  Your physician has requested that you have a lexiscan myoview. For further information please visit [email or web address]. Please follow instruction sheet, as given.  Follow up in 1 year with Dr Hochrein.  You will receive a letter in the mail 2 months before you are due.  Please call us when you receive this letter to schedule your follow up appointment.  

## 2012-01-20 NOTE — Progress Notes (Signed)
HPI The patient presents for one year follow up.  Since I last saw him he's had a neurogenic bladder and prostatic hypertrophy. He now is status post suprapubic catheter placement. He did have one episode of chest discomfort that he said was about a week ago. He thought he might have eaten something. It happened in the middle of the night it was a more short substernal discomfort. It passed after several minutes. He takes an aspirin. He otherwise has not had any chest discomfort. He does his activities of daily living and still cares for his wife who has advancing dementia. He denies any other acute cardiovascular symptoms such as PND or orthopnea. Denies any acute shortness of breath.  No Known Allergies  Current Outpatient Prescriptions  Medication Sig Dispense Refill  . aspirin EC 81 MG tablet Take 81 mg by mouth 2 (two) times daily.      Marland Kitchen atenolol (TENORMIN) 25 MG tablet Take 25 mg by mouth at bedtime.       Marland Kitchen atorvastatin (LIPITOR) 10 MG tablet Take 10 mg by mouth at bedtime.       Jannetta Quint SALINE NASAL NA Place 1 spray into the nose daily as needed. Allergies      . cetirizine (ZYRTEC) 10 MG tablet Take 10 mg by mouth daily.      Marland Kitchen levothyroxine (SYNTHROID) 100 MCG tablet Take 100 mcg by mouth daily before breakfast.       . Multiple Vitamins-Minerals (MULTI COMPLETE PO) Take 1 tablet by mouth daily.       . mupirocin ointment (BACTROBAN) 2 %       . ciprofloxacin (CIPRO) 250 MG tablet       . finasteride (PROSCAR) 5 MG tablet Take 5 mg by mouth daily.       Marland Kitchen HYDROcodone-acetaminophen (NORCO/VICODIN) 5-325 MG per tablet Take 1-2 tablets by mouth every 6 (six) hours as needed for pain.  20 tablet  0  . ramipril (ALTACE) 10 MG tablet Take 10 mg by mouth at bedtime.        Past Medical History  Diagnosis Date  . Inguinal hernia     left  . Thyroid disease   . Dyslipidemia   . BPH (benign prostatic hyperplasia)   . Coronary artery disease     status post cabg in 1996 by Donata Clay  with LIMA to the LAD, SVG to D1 and D2, SVG to circumflex, SVG to right coronary artery  . Hypertension   . Hypothyroidism   . Chronic kidney disease   . Presence of indwelling urinary catheter   . Weight loss     25 lbs over last few months per pt   . Arthritis     lower back - pt has epidural injections every few months     Past Surgical History  Procedure Date  . Coronary artery bypass graft     1996  . Cyst on necksurgery      ROS: Back pain. Otherwise as stated in the HPI and negative for all other systems.  PHYSICAL EXAM BP 110/55  Pulse 80  Ht 5\' 8"  (1.727 m)  Wt 135 lb 12.8 oz (61.598 kg)  BMI 20.65 kg/m2 GENERAL:  Well appearing, but frail HEENT:  Pupils equal round and reactive, fundi not visualized, oral mucosa unremarkable NECK:  No jugular venous distention, waveform within normal limits, carotid upstroke brisk and symmetric, no bruits, no thyromegaly LYMPHATICS:  No cervical, inguinal adenopathy LUNGS:  Clear to  auscultation bilaterally BACK:  No CVA tenderness CHEST:  Well healed sternotomy scar. HEART:  PMI not displaced or sustained,S1 and S2 within normal limits, no S3, no S4, no clicks, no rubs, no murmurs ABD:  Flat, positive bowel sounds normal in frequency in pitch, no bruits, no rebound, no guarding, no midline pulsatile mass, no hepatomegaly, no splenomegaly EXT:  2 plus pulses throughout, no edema, no cyanosis no clubbing SKIN:  No rashes no nodules NEURO:  Cranial nerves II through XII grossly intact, motor grossly intact throughout PSYCH:  Cognitively intact, oriented to person place and time  EKG:  Sinus rhythm, rate 80, axis within normal limits, intervals within normal limits, left atrial enlargement, nonspecific ST-T wave changes.  ASSESSMENT AND PLAN  CAD -  Given the fact he had one episode of chest discomfort and that he is grafts are now 78 years old I will bring him back for a stress test. He would not be a walk a treadmill so he will  have a  [employer].  DYSLIPIDEMIA -  This is followed by Junious Silk, MD . I will defer to his management. I would suggest a goal LDL less than 100.   ESSENTIAL HYPERTENSION, BENIGN -  The blood pressure is at target. No change in medications is indicated. We will continue with therapeutic lifestyle changes (TLC).

## 2012-01-30 ENCOUNTER — Encounter (HOSPITAL_COMMUNITY): Payer: Medicare Other

## 2012-03-08 ENCOUNTER — Other Ambulatory Visit: Payer: Self-pay | Admitting: Cardiology

## 2012-03-09 NOTE — Telephone Encounter (Signed)
..   Requested Prescriptions   Pending Prescriptions Disp Refills  . atenolol (TENORMIN) 50 MG tablet [Pharmacy Med Name: ATENOLOL 50 MG TABLET] 30 tablet 6    Sig: TAKE 1/2 TABLET DAILY

## 2012-03-12 ENCOUNTER — Other Ambulatory Visit: Payer: Self-pay | Admitting: Cardiology

## 2013-02-19 ENCOUNTER — Ambulatory Visit: Payer: Medicare Other | Admitting: Cardiology

## 2013-03-23 ENCOUNTER — Ambulatory Visit: Payer: Medicare Other | Admitting: Cardiology

## 2013-04-02 ENCOUNTER — Ambulatory Visit (INDEPENDENT_AMBULATORY_CARE_PROVIDER_SITE_OTHER): Payer: Medicare Other | Admitting: Cardiology

## 2013-04-02 ENCOUNTER — Encounter: Payer: Self-pay | Admitting: Cardiology

## 2013-04-02 VITALS — BP 138/74 | HR 92 | Ht 68.0 in | Wt 174.0 lb

## 2013-04-02 DIAGNOSIS — I2510 Atherosclerotic heart disease of native coronary artery without angina pectoris: Secondary | ICD-10-CM

## 2013-04-02 NOTE — Patient Instructions (Signed)
The current medical regimen is effective;  continue present plan and medications.  Your physician has requested that you have a lexiscan myoview. For further information please visit [email or web address]. Please follow instruction sheet, as given.  Follow up in 1 year with Dr Percival Spanish.  You will receive a letter in the mail 2 months before you are due.  Please call us when you receive this letter to schedule your follow up appointment.

## 2013-04-02 NOTE — Progress Notes (Signed)
HPI The patient presents follow up of CAD.  He's had a neurogenic bladder and prostatic hypertrophy. He now is status post suprapubic catheter placement.  He also had failure to thrive and weight loss. This improved with Megace. His weight is back up. At the last visit he had describes some chest discomfort which he is not experiencing currently. He doesn't recall having the discomfort. He doesn't get around very much walking with a cane. He was going to have a stress test in December but he had to cancel this because on that day his wife was ill.  He does his activities of daily living.Marland Kitchen He denies any other acute cardiovascular symptoms such as PND or orthopnea. Denies any acute shortness of breath.  His wife is now in a nursing home.   No Known Allergies  Current Outpatient Prescriptions  Medication Sig Dispense Refill  . aspirin EC 81 MG tablet Take 81 mg by mouth 2 (two) times daily.      Marland Kitchen atenolol (TENORMIN) 25 MG tablet Take 25 mg by mouth at bedtime.       Marland Kitchen atorvastatin (LIPITOR) 10 MG tablet Take 10 mg by mouth at bedtime.       Skipper Cliche SALINE NASAL NA Place 1 spray into the nose daily as needed. Allergies      . cetirizine (ZYRTEC) 10 MG tablet Take 10 mg by mouth daily.      Marland Kitchen HYDROcodone-acetaminophen (NORCO/VICODIN) 5-325 MG per tablet Take 1-2 tablets by mouth every 6 (six) hours as needed for pain.  20 tablet  0  . levothyroxine (SYNTHROID, LEVOTHROID) 150 MCG tablet Take 150 mcg by mouth daily before breakfast.      . megestrol (MEGACE) 400 MG/10ML suspension Take by mouth 2 (two) times daily.      . methocarbamol (ROBAXIN) 500 MG tablet Take 500 mg by mouth as needed for muscle spasms.      . Multiple Vitamins-Minerals (MULTI COMPLETE PO) Take 1 tablet by mouth daily.        No current facility-administered medications for this visit.    Past Medical History  Diagnosis Date  . Inguinal hernia     left  . Thyroid disease   . Dyslipidemia   . BPH (benign prostatic  hyperplasia)   . Coronary artery disease     status post cabg in 1996 by Prescott Gum with LIMA to the LAD, SVG to D1 and D2, SVG to circumflex, SVG to right coronary artery  . Hypertension   . Hypothyroidism   . Chronic kidney disease   . Presence of indwelling urinary catheter   . Weight loss     25 lbs over last few months per pt   . Arthritis     lower back - pt has epidural injections every few months     Past Surgical History  Procedure Laterality Date  . Coronary artery bypass graft      1996  . Cyst on necksurgery       ROS: Back pain. Otherwise as stated in the HPI and negative for all other systems.  PHYSICAL EXAM BP 138/74  Pulse 92  Ht 5\' 8"  (1.727 m)  Wt 174 lb (78.926 kg)  BMI 26.46 kg/m2 GENERAL:  Well appearing, but frail HEENT:  Pupils equal round and reactive, fundi not visualized, oral mucosa unremarkable NECK:  No jugular venous distention, waveform within normal limits, carotid upstroke brisk and symmetric, no bruits, no thyromegaly LYMPHATICS:  No cervical, inguinal adenopathy  LUNGS:  Clear to auscultation bilaterally BACK:  No CVA tenderness CHEST:  Well healed sternotomy scar. HEART:  PMI not displaced or sustained,S1 and S2 within normal limits, no S3, no S4, no clicks, no rubs, no murmurs ABD:  Flat, positive bowel sounds normal in frequency in pitch, no bruits, no rebound, no guarding, no midline pulsatile mass, no hepatomegaly, no splenomegaly EXT:  2 plus pulses throughout, no edema, no cyanosis no clubbing SKIN:  No rashes no nodules NEURO:  Cranial nerves II through XII grossly intact, motor grossly intact throughout PSYCH:  Cognitively intact, oriented to person place and time  EKG:  Sinus rhythm, rate 92, axis within normal limits, intervals within normal limits, left atrial enlargement, nonspecific ST-T wave changes.  04/02/2013   ASSESSMENT AND PLAN  CAD -  Given a previous episode of chest pain and bypass grafts that are now 80 years old  I will bring him back for a stress test. He would not be a walk a treadmill so he will have a  [employer].  DYSLIPIDEMIA -  This is followed by Limmie Patricia, MD . I will defer to his management. I would suggest a goal LDL less than 100.   ESSENTIAL HYPERTENSION, BENIGN -  The blood pressure is at target. No change in medications is indicated. We will continue with therapeutic lifestyle changes (TLC)

## 2013-04-06 ENCOUNTER — Other Ambulatory Visit: Payer: Self-pay | Admitting: Cardiology

## 2013-04-14 ENCOUNTER — Encounter: Payer: Self-pay | Admitting: Cardiology

## 2013-04-21 ENCOUNTER — Ambulatory Visit (HOSPITAL_COMMUNITY): Payer: Medicare Other | Attending: Cardiovascular Disease | Admitting: Radiology

## 2013-04-21 ENCOUNTER — Encounter: Payer: Self-pay | Admitting: Cardiovascular Disease

## 2013-04-21 VITALS — BP 144/75 | HR 76 | Ht 68.0 in | Wt 172.0 lb

## 2013-04-21 DIAGNOSIS — R079 Chest pain, unspecified: Secondary | ICD-10-CM | POA: Insufficient documentation

## 2013-04-21 DIAGNOSIS — I2510 Atherosclerotic heart disease of native coronary artery without angina pectoris: Secondary | ICD-10-CM

## 2013-04-21 MED ORDER — TECHNETIUM TC 99M SESTAMIBI GENERIC - CARDIOLITE
33.0000 | Freq: Once | INTRAVENOUS | Status: AC | PRN
  Administered 2013-04-21: 33 via INTRAVENOUS

## 2013-04-21 MED ORDER — REGADENOSON 0.4 MG/5ML IV SOLN
0.4000 mg | Freq: Once | INTRAVENOUS | Status: AC
  Administered 2013-04-21: 0.4 mg via INTRAVENOUS

## 2013-04-21 MED ORDER — TECHNETIUM TC 99M SESTAMIBI GENERIC - CARDIOLITE
10.8000 | Freq: Once | INTRAVENOUS | Status: AC | PRN
  Administered 2013-04-21: 11 via INTRAVENOUS

## 2013-04-21 NOTE — Progress Notes (Signed)
[facility] [address] [place], [place] [address] [phone]    Cardiology Nuclear Med Study  James Small is a 80 y.o. male     MRN : [id number]     DOB: Jun 26, 1922  Procedure Date: 04/21/2013  Nuclear Med Background Indication for Stress Test:  Evaluation for Ischemia, Graft Patency and Abnormal EKG History:  CAD, MI, Cath, CABG, MPI 2010 EF 46% Cardiac Risk Factors: Strong, Premature Family History - CAD, Hypertension and Lipids  Symptoms:  Chest Pain   Nuclear Pre-Procedure Caffeine/Decaff Intake:  None > 12 hrs NPO After: 5:00am   Lungs:  clear O2 Sat: 96% on room air. IV 0.9% NS with Angio Cath:  22g  IV Site: R Hand, tolerated well IV Started by:  Irven Baltimore, RN  Chest Size (in):  40 Cup Size: n/a  Height: 5\' 8"  (1.727 m)  Weight:  172 lb (78.019 kg)  BMI:  Body mass index is 26.16 kg/(m^2). Tech Comments:  Took Atenolol last night.    Nuclear Med Study 1 or 2 day study: 1 day  Stress Test Type:  Lexiscan  Reading MD: N/A  Order Authorizing Provider:  Minus Breeding, MD  Resting Radionuclide: Technetium 25m Sestamibi  Resting Radionuclide Dose: 11.0 mCi   Stress Radionuclide:  Technetium 57m Sestamibi  Stress Radionuclide Dose: 33.0 mCi           Stress Protocol Rest HR: 76 Stress HR: 93  Rest BP: 144/75 Stress BP: 137/59  Exercise Time (min): n/a METS: n/a           Dose of Adenosine (mg):  n/a Dose of Lexiscan: 0.4 mg  Dose of Atropine (mg): n/a Dose of Dobutamine: n/a mcg/kg/min (at max HR)  Stress Test Technologist: Glade Lloyd, BS-ES  Nuclear Technologist:  Charlton Amor, CNMT     Rest Procedure:  Myocardial perfusion imaging was performed at rest 45 minutes following the intravenous administration of Technetium 69m Sestamibi. Rest ECG: NSR, minor IVCD, very low voltage in lateral precordial leads, inferolateral T wave inversion  Stress Procedure:  The patient received IV Lexiscan 0.4 mg over 15-seconds.   Technetium 66m Sestamibi injected at 30-seconds.  Quantitative spect images were obtained after a 45 minute delay.  During the infusion of Lexiscan, the patient complained of SOB and slight nausea.  These symptoms began to resolve in recovery.  Stress ECG: No significant change from baseline ECG  QPS Raw Data Images:  The patient was scanned with both arms by his side. There is considerable attenuation artifact, especially of the basal segments of the lateral wall. Stress Images:  There is decreased uptake in the lateral wall. Rest Images:  Comparison with the stress images reveals no significant change. Subtraction (SDS):  There is a fixed defect that is most consistent with a previous infarction. Transient Ischemic Dilatation (Normal <1.22):  0.75 Lung/Heart Ratio (Normal <0.45):  0.25  Quantitative Gated Spect Images QGS EDV:  73 ml QGS ESV:  51 ml  Impression Exercise Capacity:  Lexiscan with no exercise. BP Response:  Normal blood pressure response. Clinical Symptoms:  No significant symptoms noted. ECG Impression:  No significant ECG changes with Lexiscan. Comparison with Prior Nuclear Study: No significant change from previous study  Overall Impression:  Low risk stress nuclear study with a fixed lateral wall defect. This appears to be present even after accounting for lateral wall arm attenuation.. reversible ischemia is not seen  LV Ejection Fraction: 31%.  LV Wall  Motion:  Visually, there is moderately decreased LV systolic function. QGS calculation is likely inaccurate, due to inability to track attenuated basal lateral wall.  Sanda Klein, MD, [facility] [phone] office [phone] pager

## 2013-04-28 ENCOUNTER — Telehealth: Payer: Self-pay | Admitting: Cardiology

## 2013-04-28 NOTE — Telephone Encounter (Signed)
Returned call to patient no answer.LMTC. 

## 2013-04-28 NOTE — Telephone Encounter (Signed)
Follow up    Pt returning our call for his results 3/11 test please.

## 2013-04-28 NOTE — Telephone Encounter (Signed)
Received call from patient myoview results given.

## 2014-05-12 ENCOUNTER — Other Ambulatory Visit: Payer: Self-pay

## 2014-05-12 MED ORDER — ATENOLOL 50 MG PO TABS: 25.0000 mg | ORAL_TABLET | Freq: Every day | ORAL | Status: DC

## 2014-05-19 ENCOUNTER — Ambulatory Visit (INDEPENDENT_AMBULATORY_CARE_PROVIDER_SITE_OTHER): Payer: Medicare Other | Admitting: Cardiology

## 2014-05-19 ENCOUNTER — Encounter: Payer: Self-pay | Admitting: Cardiology

## 2014-05-19 VITALS — BP 106/60 | HR 79 | Ht 68.0 in | Wt 190.5 lb

## 2014-05-19 DIAGNOSIS — I2510 Atherosclerotic heart disease of native coronary artery without angina pectoris: Secondary | ICD-10-CM

## 2014-05-19 NOTE — Progress Notes (Signed)
HPI The patient presents follow up of CAD.  He's had a neurogenic bladder and prostatic hypertrophy. He has a suprapubic catheter placement.  Since I last saw him he started taking Megace for weight loss and has gained weight. His wife who was ill has died. He's now alone at home. He gets around with a cane and does some daily chores. He doesn't have any overt cardiovascular symptoms. He had a stress test last year which demonstrated a fixed defect probably because his arm which was somewhat immobile got in the way. The ejection fraction was said to be low but we are managing him medically. He's not had any cardiovascular symptoms. The patient denies any new symptoms such as chest discomfort, neck or arm discomfort. There has been no new shortness of breath, PND or orthopnea. There have been no reported palpitations, presyncope or syncope.   No Known Allergies  Current Outpatient Prescriptions  Medication Sig Dispense Refill  . aspirin EC 81 MG tablet Take 81 mg by mouth 2 (two) times daily.    Marland Kitchen atenolol (TENORMIN) 50 MG tablet Take 0.5 tablets (25 mg total) by mouth daily. 30 tablet 6  . atorvastatin (LIPITOR) 10 MG tablet Take 10 mg by mouth at bedtime.     Skipper Cliche SALINE NASAL NA Place 1 spray into the nose daily as needed. Allergies    . cetirizine (ZYRTEC) 10 MG tablet Take 10 mg by mouth daily.    Marland Kitchen HYDROcodone-acetaminophen (NORCO/VICODIN) 5-325 MG per tablet Take 1-2 tablets by mouth every 6 (six) hours as needed for pain. 20 tablet 0  . levothyroxine (SYNTHROID, LEVOTHROID) 150 MCG tablet Take 150 mcg by mouth daily before breakfast.    . megestrol (MEGACE) 400 MG/10ML suspension Take by mouth 2 (two) times daily.    . methocarbamol (ROBAXIN) 500 MG tablet Take 500 mg by mouth as needed for muscle spasms.    . Multiple Vitamins-Minerals (MULTI COMPLETE PO) Take 1 tablet by mouth daily.      No current facility-administered medications for this visit.    Past Medical History    Diagnosis Date  . Inguinal hernia     left  . Thyroid disease   . Dyslipidemia   . BPH (benign prostatic hyperplasia)   . Coronary artery disease     status post cabg in 1996 by Prescott Gum with LIMA to the LAD, SVG to D1 and D2, SVG to circumflex, SVG to right coronary artery  . Hypertension   . Hypothyroidism   . Chronic kidney disease   . Presence of indwelling urinary catheter   . Weight loss     25 lbs over last few months per pt   . Arthritis     lower back - pt has epidural injections every few months     Past Surgical History  Procedure Laterality Date  . Coronary artery bypass graft      1996  . Cyst on necksurgery       ROS: Back pain. Otherwise as stated in the HPI and negative for all other systems.  PHYSICAL EXAM BP 106/60 mmHg  Pulse 79  Ht 5\' 8"  (1.727 m)  Wt 190 lb 8 oz (86.41 kg)  BMI 28.97 kg/m2 GEN:  No distress, somewhat frail NECK:  No jugular venous distention at 90 degrees, waveform within normal limits, carotid upstroke brisk and symmetric, no bruits, no thyromegaly LYMPHATICS:  No cervical adenopathy LUNGS:  Clear to auscultation bilaterally BACK:  No CVA tenderness CHEST:  Well healed sternotomy scar. HEART:  S1 and S2 within normal limits, no S3, no S4, no clicks, no rubs, no murmurs ABD:  Positive bowel sounds normal in frequency in pitch, no bruits, no rebound, no guarding, unable to assess midline mass or bruit with the patient seated. EXT:  2 plus pulses throughout, no edema, no cyanosis no clubbing SKIN:  No rashes no nodules NEURO:  Cranial nerves II through XII grossly intact, motor grossly intact throughout PSYCH:  Cognitively intact, oriented to person place and time   EKG:  Sinus rhythm, rate 79, axis within normal limits, intervals within normal limits, left atrial enlargement, nonspecific ST-T wave changes.  05/19/2014   ASSESSMENT AND PLAN  CAD -  He does have 79 year old bypass grafts.  However, he had no high risk findings  on his stress test last year and no new symptoms. No further cardiovascular testing is suggested.  DYSLIPIDEMIA -  This is followed by Limmie Patricia, MD . I will defer to his management.  ESSENTIAL HYPERTENSION, BENIGN -  The blood pressure is at target. No change in medications is indicated. We will continue with therapeutic lifestyle changes (TLC)

## 2014-05-19 NOTE — Patient Instructions (Signed)
Dr Percival Spanish has made NO changes in your current medications or treatment plan.  Dr Percival Spanish recommends that you schedule a follow-up appointment in 1 year. You will receive a reminder letter in the mail two months in advance. If you don't receive a letter, please call our office to schedule the follow-up appointment.

## 2014-06-10 ENCOUNTER — Other Ambulatory Visit: Payer: Self-pay | Admitting: *Deleted

## 2014-06-10 MED ORDER — ATENOLOL 50 MG PO TABS: 25.0000 mg | ORAL_TABLET | Freq: Every day | ORAL | Status: DC

## 2014-09-22 ENCOUNTER — Encounter (HOSPITAL_COMMUNITY): Payer: Self-pay

## 2014-09-22 ENCOUNTER — Emergency Department (HOSPITAL_COMMUNITY)
Admission: EM | Admit: 2014-09-22 | Discharge: 2014-09-22 | Disposition: A | Discharge status: Home / Self Care | Payer: Medicare Other | Attending: Emergency Medicine | Admitting: Emergency Medicine

## 2014-09-22 DIAGNOSIS — I129 Hypertensive chronic kidney disease with stage 1 through stage 4 chronic kidney disease, or unspecified chronic kidney disease: Secondary | ICD-10-CM | POA: Insufficient documentation

## 2014-09-22 DIAGNOSIS — I2510 Atherosclerotic heart disease of native coronary artery without angina pectoris: Secondary | ICD-10-CM | POA: Insufficient documentation

## 2014-09-22 DIAGNOSIS — R339 Retention of urine, unspecified: Secondary | ICD-10-CM | POA: Diagnosis present

## 2014-09-22 DIAGNOSIS — N189 Chronic kidney disease, unspecified: Secondary | ICD-10-CM | POA: Diagnosis not present

## 2014-09-22 DIAGNOSIS — M47896 Other spondylosis, lumbar region: Secondary | ICD-10-CM | POA: Insufficient documentation

## 2014-09-22 DIAGNOSIS — Z87438 Personal history of other diseases of male genital organs: Secondary | ICD-10-CM | POA: Diagnosis not present

## 2014-09-22 DIAGNOSIS — Z8719 Personal history of other diseases of the digestive system: Secondary | ICD-10-CM | POA: Insufficient documentation

## 2014-09-22 DIAGNOSIS — Z951 Presence of aortocoronary bypass graft: Secondary | ICD-10-CM | POA: Diagnosis not present

## 2014-09-22 DIAGNOSIS — T83098A Other mechanical complication of other indwelling urethral catheter, initial encounter: Secondary | ICD-10-CM | POA: Diagnosis not present

## 2014-09-22 DIAGNOSIS — E039 Hypothyroidism, unspecified: Secondary | ICD-10-CM | POA: Insufficient documentation

## 2014-09-22 DIAGNOSIS — Y846 Urinary catheterization as the cause of abnormal reaction of the patient, or of later complication, without mention of misadventure at the time of the procedure: Secondary | ICD-10-CM | POA: Diagnosis not present

## 2014-09-22 DIAGNOSIS — Z7982 Long term (current) use of aspirin: Secondary | ICD-10-CM | POA: Diagnosis not present

## 2014-09-22 DIAGNOSIS — Z79899 Other long term (current) drug therapy: Secondary | ICD-10-CM | POA: Insufficient documentation

## 2014-09-22 DIAGNOSIS — Z466 Encounter for fitting and adjustment of urinary device: Secondary | ICD-10-CM

## 2014-09-22 DIAGNOSIS — E784 Other hyperlipidemia: Secondary | ICD-10-CM | POA: Insufficient documentation

## 2014-09-22 NOTE — ED Provider Notes (Signed)
CSN: [id number]     Arrival date & time 09/22/14  1817 History   First MD Initiated Contact with Patient 09/22/14 1849     Chief Complaint  Patient presents with  . Suprapubic Catheter Problem    . Urinary Retention     (Consider location/radiation/quality/duration/timing/severity/associated sxs/prior Treatment) Patient is a 81 y.o. male presenting with abdominal pain.  Abdominal Pain Pain location:  Suprapubic Pain quality: cramping   Pain radiates to:  Does not radiate Pain severity:  Mild Duration:  7 hours Timing:  Constant Progression:  Worsening Chronicity:  New Context: not alcohol use, not recent illness and not retching   Associated symptoms: no chills, no cough, no dysuria and no shortness of breath    Suprapubic cath hasn't drained in >6 hours and has abdominal pain since then.   Past Medical History  Diagnosis Date  . Inguinal hernia     left  . Thyroid disease   . Dyslipidemia   . BPH (benign prostatic hyperplasia)   . Coronary artery disease     status post cabg in 1996 by Prescott Gum with LIMA to the LAD, SVG to D1 and D2, SVG to circumflex, SVG to right coronary artery  . Hypertension   . Hypothyroidism   . Chronic kidney disease   . Presence of indwelling urinary catheter   . Weight loss     25 lbs over last few months per pt   . Arthritis     lower back - pt has epidural injections every few months    Past Surgical History  Procedure Laterality Date  . Coronary artery bypass graft      1996  . Cyst on necksurgery      History reviewed. No pertinent family history. Social History  Substance Use Topics  . Smoking status: Never Smoker   . Smokeless tobacco: Never Used  . Alcohol Use: No    Review of Systems  Constitutional: Negative for chills.  HENT: Negative for drooling and ear pain.   Eyes: Negative for pain and redness.  Respiratory: Negative for cough and shortness of breath.   Gastrointestinal: Positive for abdominal pain.   Endocrine: Negative for polydipsia and polyuria.  Genitourinary: Negative for dysuria.  Musculoskeletal: Negative for back pain and neck pain.  Skin: Negative for pallor and wound.      Allergies  Review of patient's allergies indicates no known allergies.  Home Medications   Prior to Admission medications   Medication Sig Start Date End Date Taking? Authorizing Provider  aspirin EC 81 MG tablet Take 81 mg by mouth 2 (two) times daily.   Yes Historical Provider, MD  atenolol (TENORMIN) 50 MG tablet Take 0.5 tablets (25 mg total) by mouth daily. 06/10/14  Yes Minus Breeding, MD  atorvastatin (LIPITOR) 10 MG tablet Take 10 mg by mouth at bedtime.    Yes Historical Provider, MD  cetirizine (ZYRTEC) 10 MG tablet Take 10 mg by mouth daily.   Yes Historical Provider, MD  levothyroxine (SYNTHROID, LEVOTHROID) 150 MCG tablet Take 150 mcg by mouth daily before breakfast.   Yes Historical Provider, MD  megestrol (MEGACE) 400 MG/10ML suspension Take by mouth 2 (two) times daily.   Yes Historical Provider, MD  Multiple Vitamins-Minerals (MULTI COMPLETE PO) Take 1 tablet by mouth daily.    Yes Historical Provider, MD  HYDROcodone-acetaminophen (NORCO/VICODIN) 5-325 MG per tablet Take 1-2 tablets by mouth every 6 (six) hours as needed for pain. Patient not taking: Reported on 09/22/2014 11/04/11  Rana Snare, MD   BP 153/83 mmHg  Pulse 94  Temp(Src) 98.6 F (37 C) (Oral)  Resp 18  SpO2 95% Physical Exam  Constitutional: He is oriented to person, place, and time. He appears well-developed and well-nourished.  HENT:  Head: Normocephalic and atraumatic.  Eyes: Conjunctivae and EOM are normal.  Neck: Normal range of motion. Neck supple.  Cardiovascular: Normal rate and regular rhythm.   Pulmonary/Chest: Effort normal. No respiratory distress.  Abdominal: Soft. There is tenderness (suprapubic).  Musculoskeletal: Normal range of motion. He exhibits no edema or tenderness.  Neurological: He is  alert and oriented to person, place, and time.  Skin: Skin is warm and dry.  Nursing note and vitals reviewed.   ED Course  BLADDER CATHETERIZATION Date/Time: 09/22/2014 7:20 PM Performed by: Merrily Pew Authorized by: Merrily Pew Consent: Verbal consent obtained. Risks and benefits: risks, benefits and alternatives were discussed Consent given by: patient Patient understanding: patient states understanding of the procedure being performed Required items: required blood products, implants, devices, and special equipment available Patient identity confirmed: verbally with patient Indications: catheter change Local anesthesia used: no Patient sedated: no Preparation: Patient was prepped and draped in the usual sterile fashion. Catheter insertion: indwelling Catheter size: 20 Fr Complicated insertion: no Altered anatomy: no Bladder irrigation: no Number of attempts: 1 Urine volume: 500 ml Urine characteristics: cloudy and yellow Patient tolerance: Patient tolerated the procedure well with no immediate complications   (including critical care time) Labs Review Labs Reviewed - No data to display  Imaging Review No results found. I, Haylea Schlichting, Corene Cornea, personally reviewed and evaluated these images and lab results as part of my medical decision-making.   EKG Interpretation None      MDM   Final diagnoses:  Catheter (urine) change required    Her old male history of urinary retention and suprapubic cath in place has been clogged for approximately 7 hours and had worsening suprapubic pain since that time. Foley changed as above with return of somewhat cloudy yellow urine. Hasn't had any symptoms of urinary tract infection and approximate 500 mL of output immediately with total relief of his symptoms. Doubt any intra-abdominal pathology, we'll refer her to urologist for follow-up.    Merrily Pew, MD 09/23/14 [phone]

## 2014-09-22 NOTE — ED Notes (Signed)
Pt c/o abdominal pain, urinary retention, and catheter leakage.  Pain score 5/10.  Pt has a suprapubic catheter and reports that it has not been draining since around 1230.  Pt is followed by [facility].

## 2014-11-19 ENCOUNTER — Encounter (HOSPITAL_COMMUNITY): Payer: Self-pay | Admitting: Nurse Practitioner

## 2014-11-19 ENCOUNTER — Emergency Department (HOSPITAL_COMMUNITY)
Admission: EM | Admit: 2014-11-19 | Discharge: 2014-11-19 | Disposition: A | Discharge status: Home / Self Care | Payer: Medicare Other | Attending: Emergency Medicine | Admitting: Emergency Medicine

## 2014-11-19 DIAGNOSIS — M1990 Unspecified osteoarthritis, unspecified site: Secondary | ICD-10-CM | POA: Insufficient documentation

## 2014-11-19 DIAGNOSIS — Y838 Other surgical procedures as the cause of abnormal reaction of the patient, or of later complication, without mention of misadventure at the time of the procedure: Secondary | ICD-10-CM | POA: Diagnosis not present

## 2014-11-19 DIAGNOSIS — T83090A Other mechanical complication of cystostomy catheter, initial encounter: Secondary | ICD-10-CM

## 2014-11-19 DIAGNOSIS — Z7982 Long term (current) use of aspirin: Secondary | ICD-10-CM | POA: Insufficient documentation

## 2014-11-19 DIAGNOSIS — Z79899 Other long term (current) drug therapy: Secondary | ICD-10-CM | POA: Insufficient documentation

## 2014-11-19 DIAGNOSIS — N189 Chronic kidney disease, unspecified: Secondary | ICD-10-CM | POA: Insufficient documentation

## 2014-11-19 DIAGNOSIS — T83098A Other mechanical complication of other indwelling urethral catheter, initial encounter: Secondary | ICD-10-CM | POA: Insufficient documentation

## 2014-11-19 DIAGNOSIS — N390 Urinary tract infection, site not specified: Secondary | ICD-10-CM

## 2014-11-19 DIAGNOSIS — Z8719 Personal history of other diseases of the digestive system: Secondary | ICD-10-CM | POA: Diagnosis not present

## 2014-11-19 DIAGNOSIS — Z951 Presence of aortocoronary bypass graft: Secondary | ICD-10-CM | POA: Insufficient documentation

## 2014-11-19 DIAGNOSIS — E785 Hyperlipidemia, unspecified: Secondary | ICD-10-CM | POA: Diagnosis not present

## 2014-11-19 DIAGNOSIS — I2510 Atherosclerotic heart disease of native coronary artery without angina pectoris: Secondary | ICD-10-CM | POA: Diagnosis not present

## 2014-11-19 DIAGNOSIS — I129 Hypertensive chronic kidney disease with stage 1 through stage 4 chronic kidney disease, or unspecified chronic kidney disease: Secondary | ICD-10-CM | POA: Diagnosis not present

## 2014-11-19 DIAGNOSIS — M545 Low back pain: Secondary | ICD-10-CM | POA: Diagnosis not present

## 2014-11-19 DIAGNOSIS — E039 Hypothyroidism, unspecified: Secondary | ICD-10-CM | POA: Diagnosis not present

## 2014-11-19 DIAGNOSIS — R110 Nausea: Secondary | ICD-10-CM | POA: Insufficient documentation

## 2014-11-19 LAB — URINALYSIS, ROUTINE W REFLEX MICROSCOPIC
BILIRUBIN URINE: NEGATIVE
Glucose, UA: NEGATIVE mg/dL
Ketones, ur: NEGATIVE mg/dL
Nitrite: NEGATIVE
PROTEIN: 100 mg/dL — AB
Specific Gravity, Urine: 1.013 (ref 1.005–1.030)
UROBILINOGEN UA: 0.2 mg/dL (ref 0.0–1.0)
pH: 7 (ref 5.0–8.0)

## 2014-11-19 LAB — URINE MICROSCOPIC-ADD ON

## 2014-11-19 MED ORDER — CEPHALEXIN 500 MG PO CAPS: 500.0000 mg | ORAL_CAPSULE | Freq: Two times a day (BID) | ORAL | Status: AC

## 2014-11-19 NOTE — Discharge Instructions (Signed)
Catheter-Associated Urinary Tract Infection FAQs  What is "catheter-associated urinary tract infection"?  A urinary tract infection (also called "UTI") is an infection in the urinary system, which includes the bladder (which stores the urine) and the kidneys (which filter the blood to make urine). Germs (for example, bacteria or yeasts) do not normally live in these areas; but if germs are introduced, an infection can occur.  If you have a urinary catheter, germs can travel along the catheter and cause an infection in your bladder or your kidney; in that case it is called a catheter-associated urinary tract infection (or "CA-UTI").   What is a urinary catheter?  A urinary catheter is a thin tube placed in the bladder to drain urine. Urine drains through the tube into a bag that collects the urine. A urinary catheter may be used:  · If you are not able to urinate on your own  · To measure the amount of urine that you make, for example, during intensive care  · During and after some types of surgery  · During some tests of the kidneys and bladder  People with urinary catheters have a much higher chance of getting a urinary tract infection than people who don't have a catheter.  How do I get a catheter-associated urinary tract infection (CA-UTI)?  If germs enter the urinary tract, they may cause an infection. Many of the germs that cause a catheter-associated urinary tract infection are common germs found in your intestines that do not usually cause an infection there. Germs can enter the urinary tract when the catheter is being put in or while the catheter remains in the bladder.   What are the symptoms of a urinary tract infection?  Some of the common symptoms of a urinary tract infection are:  · Burning or pain in the lower abdomen (that is, below the stomach)  · Fever  · Bloody urine may be a sign of infection, but is also caused by other problems  · Burning during urination or an increase in the frequency of  urination after the catheter is removed.  Sometimes people with catheter-associated urinary tract infections do not have these symptoms of infection.  Can catheter-associated urinary tract infections be treated?  Yes, most catheter-associated urinary tract infections can be treated with antibiotics and removal or change of the catheter. Your doctor will determine which antibiotic is best for you.   What are some of the things that hospitals are doing to prevent catheter-associated urinary tract infections?  To prevent urinary tract infections, doctors and nurses take the following actions.   Catheter insertion  · External catheters in men (these look like condoms and are placed over the penis rather than into the penis)  · Putting a temporary catheter in to drain the urine and removing it right away. This is called intermittent urethral catheterization.  Catheter care  What can I do to help prevent catheter-associated urinary tract infections if I have a catheter?  · Always clean your hands before and after doing catheter care.  · Always keep your urine bag below the level of your bladder.  · Do not tug or pull on the tubing.  · Do not twist or kink the catheter tubing.  · Ask your healthcare provider each day if you still need the catheter.  What do I need to do when I go home from the hospital?  · If you will be going home with a catheter, your doctor or nurse should explain everything   you need to know about taking care of the catheter. Make sure you understand how to care for it before you leave the hospital.  · If you develop any of the symptoms of a urinary tract infection, such as burning or pain in the lower abdomen, fever, or an increase in the frequency of urination, contact your doctor or nurse immediately.  · Before you go home, make sure you know who to contact if you have questions or problems after you get home.  If you have questions, please ask your doctor or nurse.  Developed and co-sponsored by The  Society for Healthcare Epidemiology of America (SHEA); Infectious Diseases Society of America (IDSA); American Hospital Association; Association for Professionals in Infection Control and Epidemiology (APIC); Centers for Disease Control and Prevention (CDC); and The Joint Commission.     This information is not intended to replace advice given to you by your health care provider. Make sure you discuss any questions you have with your health care provider.     Document Released: 10/23/2011 Document Revised: 06/14/2014 Document Reviewed: 04/13/2014  Elsevier Interactive Patient Education ©2016 [employer]

## 2014-11-19 NOTE — ED Notes (Signed)
Patient suprapubic catheter was flushed and repositioned. Catheter would not flush. Catheter was replaced with a new one. Urine flowed into to bag without any problem. Patient feels better.

## 2014-11-19 NOTE — ED Notes (Signed)
Pt states his suprapubic cath is "not working" meaning it is not draining, also c/o back pain which he states he hurt when he sat up for 2 hrs trying to fix the cath.

## 2014-11-20 NOTE — ED Provider Notes (Signed)
CSN: [id number]     Arrival date & time 11/19/14  2005 History   First MD Initiated Contact with Patient 11/19/14 2010     Chief Complaint  Patient presents with  . Suprapubic Cath Problem   . Back Pain     (Consider location/radiation/quality/duration/timing/severity/associated sxs/prior Treatment) HPI Comments: Suprapubic catheter last exchanged 9/21 Presenting after catheter noted to be obstructed at 430PM Pt tried changing leg bag, however aggravated chronic back pain while leaning over and did not have relief of catheter obstruction Presents with initial severe suprapubic abdominal pressure Lower back pain severe-moderate Nausea associated with pain No new weakness, numbness or bowel incontinence    Patient is a 81 y.o. male presenting with back pain.  Back Pain Location:  Lumbar spine Radiates to:  Does not radiate Pain severity:  Severe Associated symptoms: abdominal pain   Associated symptoms: no chest pain, no fever, no headaches, no numbness, no perianal numbness and no weakness     Past Medical History  Diagnosis Date  . Inguinal hernia     left  . Thyroid disease   . Dyslipidemia   . BPH (benign prostatic hyperplasia)   . Coronary artery disease     status post cabg in 1996 by Prescott Gum with LIMA to the LAD, SVG to D1 and D2, SVG to circumflex, SVG to right coronary artery  . Hypertension   . Hypothyroidism   . Chronic kidney disease   . Presence of indwelling urinary catheter   . Weight loss     25 lbs over last few months per pt   . Arthritis     lower back - pt has epidural injections every few months    Past Surgical History  Procedure Laterality Date  . Coronary artery bypass graft      1996  . Cyst on necksurgery      History reviewed. No pertinent family history. Social History  Substance Use Topics  . Smoking status: Never Smoker   . Smokeless tobacco: Never Used  . Alcohol Use: No    Review of Systems  Constitutional: Negative for  fever, activity change and appetite change.  HENT: Negative for sore throat.   Eyes: Negative for visual disturbance.  Respiratory: Negative for shortness of breath.   Cardiovascular: Negative for chest pain.  Gastrointestinal: Positive for nausea and abdominal pain. Negative for vomiting, diarrhea and constipation.  Genitourinary: Positive for difficulty urinating (suprapubic catheter obstructed). Negative for flank pain.  Musculoskeletal: Positive for back pain (hx of chronic back pain, aggravated today while trying to change catheter bag). Negative for neck stiffness.  Skin: Negative for rash.  Neurological: Negative for syncope, weakness, numbness and headaches.      Allergies  Review of patient's allergies indicates no known allergies.  Home Medications   Prior to Admission medications   Medication Sig Start Date End Date Taking? Authorizing Provider  aspirin EC 81 MG tablet Take 81 mg by mouth 2 (two) times daily.    Historical Provider, MD  atenolol (TENORMIN) 50 MG tablet Take 0.5 tablets (25 mg total) by mouth daily. 06/10/14   Minus Breeding, MD  atorvastatin (LIPITOR) 10 MG tablet Take 10 mg by mouth at bedtime.     Historical Provider, MD  cephALEXin (KEFLEX) 500 MG capsule Take 1 capsule (500 mg total) by mouth 2 (two) times daily. 11/19/14 11/29/14  Gareth Morgan, MD  cetirizine (ZYRTEC) 10 MG tablet Take 10 mg by mouth daily.    Historical Provider, MD  HYDROcodone-acetaminophen (NORCO/VICODIN) 5-325 MG per tablet Take 1-2 tablets by mouth every 6 (six) hours as needed for pain. Patient not taking: Reported on 09/22/2014 11/04/11   Rana Snare, MD  levothyroxine (SYNTHROID, LEVOTHROID) 150 MCG tablet Take 150 mcg by mouth daily before breakfast.    Historical Provider, MD  megestrol (MEGACE) 400 MG/10ML suspension Take by mouth 2 (two) times daily.    Historical Provider, MD  Multiple Vitamins-Minerals (MULTI COMPLETE PO) Take 1 tablet by mouth daily.     Historical  Provider, MD   BP 128/63 mmHg  Pulse 100  Temp(Src) 98.8 F (37.1 C) (Oral)  Resp 16  SpO2 93% Physical Exam  Constitutional: He is oriented to person, place, and time. He appears well-developed and well-nourished. No distress.  HENT:  Head: Normocephalic and atraumatic.  Eyes: Conjunctivae and EOM are normal.  Neck: Normal range of motion.  Cardiovascular: Normal rate, regular rhythm, normal heart sounds and intact distal pulses.  Exam reveals no gallop and no friction rub.   No murmur heard. Pulmonary/Chest: Effort normal and breath sounds normal. No respiratory distress. He has no wheezes. He has no rales.  Abdominal: Soft. He exhibits no distension. There is no tenderness. There is no guarding.  Musculoskeletal: He exhibits no edema.  Neurological: He is alert and oriented to person, place, and time.  Skin: Skin is warm and dry. He is not diaphoretic.  Nursing note and vitals reviewed.   ED Course  Procedures (including critical care time) Labs Review Labs Reviewed  URINALYSIS, ROUTINE W REFLEX MICROSCOPIC (NOT AT [facility]) - Abnormal; Notable for the following:    APPearance TURBID (*)    Hgb urine dipstick MODERATE (*)    Protein, ur 100 (*)    Leukocytes, UA LARGE (*)    All other components within normal limits  URINE MICROSCOPIC-ADD ON - Abnormal; Notable for the following:    Bacteria, UA MANY (*)    All other components within normal limits  URINE CULTURE    Imaging Review No results found. I have personally reviewed and evaluated these images and lab results as part of my medical decision-making.   EKG Interpretation None      MDM   Final diagnoses:  Obstruction of suprapubic catheter, initial encounter ([place])  UTI (lower urinary tract infection)   81yo male with hs of CAD, BPH, CKD, htn, hlpd, back pain, presents with concern of suprapubic catheter which became obstructed at 430PM with lower abdominal pain.  Catheter last exchanged 9/21.  Large amount of  sediment noted obstructing catheter and catheter was changed with urinalysis taken from new catheter. Pt abdominal pain and pressure relieved after placement of new catheter with large amt of urine returned.  Urinalysis may suggest colonization, however there are a large amt of WBC, and pt was having symptoms of obstruction and lower abd pain and will treat for UTI with keflex.  Recommend close follow up with urology.  Offered other lab work to pt and family, however do not feel his Cr would significantly change after hours of obstruction in cathter which was prior to this flowing freely and pt without other symptoms and family declines lab work. Discussed reasons to return to the ED in detail.     Gareth Morgan, MD 11/20/14 1144

## 2014-11-23 LAB — URINE CULTURE

## 2014-11-24 ENCOUNTER — Telehealth (HOSPITAL_COMMUNITY): Payer: Self-pay

## 2014-11-24 NOTE — Progress Notes (Signed)
ED Antimicrobial Stewardship Positive Culture Follow Up   James Small is an 81 y.o. male who presented to [facility] on 11/19/2014 with a chief complaint of  Chief Complaint  Patient presents with  . Suprapubic Cath Problem   . Back Pain    Recent Results (from the past 720 hour(s))  Urine culture     Status: None   Collection Time: 11/19/14  8:37 PM  Result Value Ref Range Status   Specimen Description URINE, CATHETERIZED  Final   Special Requests NONE  Final   Culture   Final    >=100,000 COLONIES/mL KLEBSIELLA PNEUMONIAE >=100,000 COLONIES/mL MORGANELLA MORGANII Performed at [facility]    Report Status 11/23/2014 FINAL  Final   Organism ID, Bacteria KLEBSIELLA PNEUMONIAE  Final   Organism ID, Bacteria MORGANELLA MORGANII  Final      Susceptibility   Klebsiella pneumoniae - MIC*    AMPICILLIN >=32 RESISTANT Resistant     CEFAZOLIN <=4 SENSITIVE Sensitive     CEFTRIAXONE <=1 SENSITIVE Sensitive     CIPROFLOXACIN 0.5 SENSITIVE Sensitive     GENTAMICIN <=1 SENSITIVE Sensitive     IMIPENEM <=0.25 SENSITIVE Sensitive     NITROFURANTOIN 128 RESISTANT Resistant     TRIMETH/SULFA <=20 SENSITIVE Sensitive     AMPICILLIN/SULBACTAM 8 SENSITIVE Sensitive     PIP/TAZO 8 SENSITIVE Sensitive     * >=100,000 COLONIES/mL KLEBSIELLA PNEUMONIAE   Morganella morganii - MIC*    AMPICILLIN >=32 RESISTANT Resistant     CEFAZOLIN >=64 RESISTANT Resistant     CEFTRIAXONE <=1 SENSITIVE Sensitive     CIPROFLOXACIN <=0.25 SENSITIVE Sensitive     GENTAMICIN <=1 SENSITIVE Sensitive     IMIPENEM 1 SENSITIVE Sensitive     NITROFURANTOIN 64 RESISTANT Resistant     TRIMETH/SULFA <=20 SENSITIVE Sensitive     AMPICILLIN/SULBACTAM 16 INTERMEDIATE Intermediate     PIP/TAZO <=4 SENSITIVE Sensitive     * >=100,000 COLONIES/mL MORGANELLA MORGANII    [x]  Treated with Keflex, organism resistant to prescribed antimicrobial  New antibiotic prescription: Bactrim DS po 1 tab BID x 10 days. #20  No Refills.  ED Provider: Monico Blitz PA-C  81 y/o M w/ concern for suprapubic catheter being obstructed and some abdominal pain. Catheter exchanged and UA obtained with new catheter. Pt discharged with keflex for UTI. Pt grew klebsiella and morganella morganii. Morganella was resistant to Keflex. Will discontinue keflex and being Bactrim for 10 days for complicated UTI.   Angela Burke, PharmD Pharmacy Resident Pager: [phone] 11/24/2014, 8:53 AM Infectious Diseases Pharmacist Phone# [phone]

## 2014-11-24 NOTE — Telephone Encounter (Signed)
Post ED Visit - Positive Culture Follow-up: Successful Patient Follow-Up  Culture assessed and recommendations reviewed by: []  Heide Guile, Pharm.D., BCPS-AQ ID []  Alycia Rossetti, Pharm.D., BCPS []  [place], Pharm.D., BCPS, AAHIVP []  Legrand Como, Pharm.D., BCPS, AAHIVP []  [employer], Pharm.D. []  Milus Glazier, Pharm.D. Earnestine Mealing, Pharm.D.  Positive urine culture, >/= 100,000 colonies -> Klebsiella Pneumoniae & Morganella Morganii  []  Patient discharged without antimicrobial prescription and treatment is now indicated [x]  Organism(Morganella Morganii) is resistant to prescribed ED discharge antimicrobial []  Patient with positive blood cultures  Changes discussed with ED provider: N. Pisciotta PA-C New antibiotic prescription "Bactrim DS 1 tab BID x 10 days"  Contacted patient, date 11/24/2014, time 13:40  LVM requesting callback.   Dortha Kern 11/24/2014, 1:44 PM

## 2014-11-25 ENCOUNTER — Telehealth (HOSPITAL_COMMUNITY): Payer: Self-pay

## 2014-11-26 ENCOUNTER — Telehealth (HOSPITAL_BASED_OUTPATIENT_CLINIC_OR_DEPARTMENT_OTHER): Payer: Self-pay | Admitting: Emergency Medicine

## 2014-11-26 NOTE — Telephone Encounter (Signed)
Post ED Visit - Positive Culture Follow-up: Successful Patient Follow-Up  Culture assessed and recommendations reviewed by: []  Heide Guile, Pharm.D., BCPS-AQ ID []  Alycia Rossetti, Pharm.D., BCPS []  [place], [place].D., BCPS, AAHIVP []  Legrand Como, Pharm.D., BCPS, AAHIVP []  [place], Pharm.D. []  Milus Glazier, [place].D.  Positive urine culture Klebsiella and morganella  []  Patient discharged without antimicrobial prescription and treatment is now indicated [x]  Organism is resistant to prescribed ED discharge antimicrobial []  Patient with positive blood cultures  Changes discussed with ED provider: Monico Blitz PA New antibiotic prescription stop keflex, start Bactrim DS 1 tab po bid x10 days Called to [facility]   Contacted patient, 11/26/14 [place], James Small 11/26/2014, 10:07 AM

## 2015-03-01 ENCOUNTER — Other Ambulatory Visit: Payer: Self-pay | Admitting: Cardiology

## 2015-03-01 NOTE — Telephone Encounter (Signed)
Rx request sent to pharmacy.  

## 2015-06-07 ENCOUNTER — Encounter (HOSPITAL_COMMUNITY): Payer: Self-pay

## 2015-06-07 ENCOUNTER — Emergency Department (HOSPITAL_COMMUNITY)
Admission: EM | Admit: 2015-06-07 | Discharge: 2015-06-07 | Disposition: A | Discharge status: Home / Self Care | Payer: Medicare Other | Attending: Emergency Medicine | Admitting: Emergency Medicine

## 2015-06-07 DIAGNOSIS — Z79899 Other long term (current) drug therapy: Secondary | ICD-10-CM | POA: Insufficient documentation

## 2015-06-07 DIAGNOSIS — N189 Chronic kidney disease, unspecified: Secondary | ICD-10-CM | POA: Insufficient documentation

## 2015-06-07 DIAGNOSIS — I129 Hypertensive chronic kidney disease with stage 1 through stage 4 chronic kidney disease, or unspecified chronic kidney disease: Secondary | ICD-10-CM | POA: Insufficient documentation

## 2015-06-07 DIAGNOSIS — E039 Hypothyroidism, unspecified: Secondary | ICD-10-CM | POA: Diagnosis not present

## 2015-06-07 DIAGNOSIS — Y658 Other specified misadventures during surgical and medical care: Secondary | ICD-10-CM | POA: Insufficient documentation

## 2015-06-07 DIAGNOSIS — T83098A Other mechanical complication of other indwelling urethral catheter, initial encounter: Secondary | ICD-10-CM | POA: Insufficient documentation

## 2015-06-07 DIAGNOSIS — E785 Hyperlipidemia, unspecified: Secondary | ICD-10-CM | POA: Diagnosis not present

## 2015-06-07 DIAGNOSIS — Z87438 Personal history of other diseases of male genital organs: Secondary | ICD-10-CM | POA: Diagnosis not present

## 2015-06-07 DIAGNOSIS — M47816 Spondylosis without myelopathy or radiculopathy, lumbar region: Secondary | ICD-10-CM | POA: Diagnosis not present

## 2015-06-07 DIAGNOSIS — I2510 Atherosclerotic heart disease of native coronary artery without angina pectoris: Secondary | ICD-10-CM | POA: Insufficient documentation

## 2015-06-07 DIAGNOSIS — Z7982 Long term (current) use of aspirin: Secondary | ICD-10-CM | POA: Diagnosis not present

## 2015-06-07 DIAGNOSIS — Z951 Presence of aortocoronary bypass graft: Secondary | ICD-10-CM | POA: Insufficient documentation

## 2015-06-07 DIAGNOSIS — Z8719 Personal history of other diseases of the digestive system: Secondary | ICD-10-CM | POA: Insufficient documentation

## 2015-06-07 DIAGNOSIS — R110 Nausea: Secondary | ICD-10-CM | POA: Diagnosis not present

## 2015-06-07 DIAGNOSIS — T83090A Other mechanical complication of cystostomy catheter, initial encounter: Secondary | ICD-10-CM

## 2015-06-07 NOTE — Discharge Instructions (Signed)
FOLLOW UP WITH YOUR UROLOGIST AS DIRECTED.  RETURN HERE FOR ANY WORSENING SYMPTOMS, ABDOMINAL PAIN, VOMITING, OR FEVERS.

## 2015-06-07 NOTE — ED Notes (Signed)
Suprapubic Cath removed; foul smelling dark yellow urine coming from insertion site; new cath replaced without difficulty

## 2015-06-07 NOTE — ED Provider Notes (Signed)
CSN: [id number]     Arrival date & time 06/07/15  1727 History   First MD Initiated Contact with Patient 06/07/15 1913     Chief Complaint  Patient presents with  . suprapubic cath clogged      (Consider location/radiation/quality/duration/timing/severity/associated sxs/prior Treatment) HPI Comments: Patient presents with a clogged Foley catheter. He has a suprapubic catheter in place. It's been in place for about a year and a half. He states it's been clogged 3 times before. He states he hasn't had any output since about 2:00 this afternoon. He has some pressure in his lower abdomen and a little bit of nausea. He denies any known fevers. He states that this catheter is been in about 3 weeks and he normally gets it changed once a month. He tried to get into the urology office today but didn't make it in time.   Past Medical History  Diagnosis Date  . Inguinal hernia     left  . Thyroid disease   . Dyslipidemia   . BPH (benign prostatic hyperplasia)   . Coronary artery disease     status post cabg in 1996 by Prescott Gum with LIMA to the LAD, SVG to D1 and D2, SVG to circumflex, SVG to right coronary artery  . Hypertension   . Hypothyroidism   . Chronic kidney disease   . Presence of indwelling urinary catheter   . Weight loss     25 lbs over last few months per pt   . Arthritis     lower back - pt has epidural injections every few months    Past Surgical History  Procedure Laterality Date  . Coronary artery bypass graft      1996  . Cyst on necksurgery      Family History  Problem Relation Age of Onset  . Family history unknown: Yes   Social History  Substance Use Topics  . Smoking status: Never Smoker   . Smokeless tobacco: Never Used  . Alcohol Use: No    Review of Systems  Constitutional: Negative for fever, chills, diaphoresis and fatigue.  HENT: Negative for congestion, rhinorrhea and sneezing.   Eyes: Negative.   Respiratory: Negative for cough, chest tightness  and shortness of breath.   Cardiovascular: Negative for chest pain and leg swelling.  Gastrointestinal: Positive for nausea and abdominal pain. Negative for vomiting, diarrhea and blood in stool.  Genitourinary: Negative for frequency, hematuria, flank pain and difficulty urinating.  Musculoskeletal: Negative for back pain and arthralgias.  Skin: Negative for rash.  Neurological: Negative for dizziness, speech difficulty, weakness, numbness and headaches.      Allergies  Review of patient's allergies indicates no known allergies.  Home Medications   Prior to Admission medications   Medication Sig Start Date End Date Taking? Authorizing Provider  aspirin EC 81 MG tablet Take 81 mg by mouth 2 (two) times daily.   Yes Historical Provider, MD  atenolol (TENORMIN) 50 MG tablet TAKE 1/2 TABLET BY MOUTH DAILY 03/01/15  Yes Minus Breeding, MD  atorvastatin (LIPITOR) 10 MG tablet Take 10 mg by mouth at bedtime.    Yes Historical Provider, MD  cetirizine (ZYRTEC) 10 MG tablet Take 10 mg by mouth daily.   Yes Historical Provider, MD  levothyroxine (SYNTHROID, LEVOTHROID) 150 MCG tablet Take 150 mcg by mouth daily before breakfast.   Yes Historical Provider, MD  megestrol (MEGACE) 400 MG/10ML suspension Take by mouth 2 (two) times daily.   Yes Historical Provider, MD  Multiple  Vitamins-Minerals (MULTI COMPLETE PO) Take 1 tablet by mouth daily.    Yes Historical Provider, MD  HYDROcodone-acetaminophen (NORCO/VICODIN) 5-325 MG per tablet Take 1-2 tablets by mouth every 6 (six) hours as needed for pain. Patient not taking: Reported on 09/22/2014 11/04/11   Rana Snare, MD   BP 149/79 mmHg  Pulse 106  Temp(Src) 98 F (36.7 C) (Oral)  Resp 14  Ht 5\' 8"  (1.727 m)  Wt 180 lb (81.647 kg)  BMI 27.38 kg/m2  SpO2 95% Physical Exam  Constitutional: He is oriented to person, place, and time. He appears well-developed and well-nourished.  HENT:  Head: Normocephalic and atraumatic.  Eyes: Pupils are  equal, round, and reactive to light.  Neck: Normal range of motion. Neck supple.  Cardiovascular: Normal rate, regular rhythm and normal heart sounds.   Pulmonary/Chest: Effort normal and breath sounds normal. No respiratory distress. He has no wheezes. He has no rales. He exhibits no tenderness.  Abdominal: Soft. Bowel sounds are normal. There is tenderness. There is no rebound and no guarding.  Suprapubic catheter in place, mild TTP suprapubic area  Musculoskeletal: Normal range of motion. He exhibits no edema.  Lymphadenopathy:    He has no cervical adenopathy.  Neurological: He is alert and oriented to person, place, and time.  Skin: Skin is warm and dry. No rash noted.  Psychiatric: He has a normal mood and affect.    ED Course  Procedures (including critical care time) Labs Review Labs Reviewed - No data to display  Imaging Review No results found. I have personally reviewed and evaluated these images and lab results as part of my medical decision-making.   EKG Interpretation None      MDM   Final diagnoses:  Blocked suprapubic catheter, initial encounter [facility])   Patient presents with an obstructed suprapubic catheter. The catheter was changed out here in the ED. It's flowing normally. He's currently asymptomatic. He feels much better. He doesn't have any abdominal pain and fever or other symptoms that would be more concerning for a UTI. He was discharged home in good condition. He was encouraged to follow-up with his urologist as directed. He was advised to return if he has any worsening symptoms including abdominal pain vomiting or fevers.    Malvin Johns, MD 06/07/15 2037

## 2015-06-07 NOTE — ED Notes (Signed)
Patient states he has not had any urine from his suprapubic cath since 1400 today. Patient called his urologist and was told to come to the ED

## 2015-07-03 ENCOUNTER — Telehealth: Payer: Self-pay | Admitting: Cardiology

## 2015-07-03 NOTE — Telephone Encounter (Signed)
Received records from [facility] for appointment on 07/19/15 with Dr Percival Spanish.  Records given to [facility] (medical records) for Dr Hochrein's schedule on 07/19/15. lp

## 2015-07-18 NOTE — Progress Notes (Signed)
HPI The patient presents follow up of CAD.  He's had a neurogenic bladder and prostatic hypertrophy. He has a suprapubic catheter placement. I've seen that he's been in the emergency room with problems at times with his catheter. SCDs been seen for weight loss by his primary provider. However, he's not had any significant cardiac abnormality since I saw him. He gets around with a walker. He denies any new shortness of breath, PND or orthopnea. She's had no palpitations, presyncope or syncope. Since I last saw him he started taking Megace for weight loss and has gained weight. His wife who was ill has died. He had a stress test two years ago which demonstrated a fixed defect probably because his arm which was somewhat immobile got in the way. The ejection fraction was said to be low but we are managing him medically.   No Known Allergies  Current Outpatient Prescriptions  Medication Sig Dispense Refill  . aspirin EC 81 MG tablet Take 81 mg by mouth 2 (two) times daily.    Marland Kitchen atenolol (TENORMIN) 50 MG tablet TAKE 1/2 TABLET BY MOUTH DAILY 45 tablet 1  . atorvastatin (LIPITOR) 10 MG tablet Take 10 mg by mouth at bedtime.     . cetirizine (ZYRTEC) 10 MG tablet Take 10 mg by mouth daily.    Marland Kitchen HYDROcodone-acetaminophen (NORCO/VICODIN) 5-325 MG per tablet Take 1-2 tablets by mouth every 6 (six) hours as needed for pain. 20 tablet 0  . levothyroxine (SYNTHROID, LEVOTHROID) 150 MCG tablet Take 150 mcg by mouth daily before breakfast.    . megestrol (MEGACE) 400 MG/10ML suspension Take by mouth 2 (two) times daily.    . Multiple Vitamins-Minerals (MULTI COMPLETE PO) Take 1 tablet by mouth daily.      No current facility-administered medications for this visit.    Past Medical History  Diagnosis Date  . Inguinal hernia     left  . Thyroid disease   . Dyslipidemia   . BPH (benign prostatic hyperplasia)   . Coronary artery disease     status post cabg in 1996 by Prescott Gum with LIMA to the LAD, SVG  to D1 and D2, SVG to circumflex, SVG to right coronary artery  . Hypertension   . Hypothyroidism   . Chronic kidney disease   . Presence of indwelling urinary catheter   . Weight loss     25 lbs over last few months per pt   . Arthritis     lower back - pt has epidural injections every few months     Past Surgical History  Procedure Laterality Date  . Coronary artery bypass graft      1996  . Cyst on necksurgery       ROS: Back pain. Otherwise as stated in the HPI and negative for all other systems.  PHYSICAL EXAM BP 124/68 mmHg  Pulse 88  Ht 5\' 8"  (1.727 m)  Wt 181 lb 9.6 oz (82.373 kg)  BMI 27.62 kg/m2 GEN:  No distress, somewhat frail NECK:  No jugular venous distention at 90 degrees, waveform within normal limits, carotid upstroke brisk and symmetric, no bruits, no thyromegaly LYMPHATICS:  No cervical adenopathy LUNGS:  Clear to auscultation bilaterally BACK:  No CVA tenderness CHEST:  Well healed sternotomy scar. HEART:  S1 and S2 within normal limits, no S3, no S4, no clicks, no rubs, no murmurs ABD:  Positive bowel sounds normal in frequency in pitch, no bruits, no rebound, no guarding, unable to assess midline  mass or bruit with the patient seated. EXT:  2 plus pulses throughout, no edema, no cyanosis no clubbing    EKG:  Sinus rhythm, rate 88, axis within normal limits, intervals within normal limits, left atrial enlargement, nonspecific ST-T wave changes.  No change from previous.  07/19/2015   ASSESSMENT AND PLAN  CAD -  He does have 80 year old bypass grafts.  However, he had no high risk findings on his stress test two years ago and no new symptoms. No further cardiovascular testing is suggested.  DYSLIPIDEMIA -  This is followed by Limmie Patricia, MD . I will defer to his management.  ESSENTIAL HYPERTENSION, BENIGN -  The blood pressure is at target. No change in medications is indicated. We will continue with therapeutic lifestyle changes (TLC)

## 2015-07-19 ENCOUNTER — Ambulatory Visit (INDEPENDENT_AMBULATORY_CARE_PROVIDER_SITE_OTHER): Payer: Medicare Other | Admitting: Cardiology

## 2015-07-19 ENCOUNTER — Encounter: Payer: Self-pay | Admitting: Cardiology

## 2015-07-19 VITALS — BP 124/68 | HR 88 | Ht 68.0 in | Wt 181.6 lb

## 2015-07-19 DIAGNOSIS — I2510 Atherosclerotic heart disease of native coronary artery without angina pectoris: Secondary | ICD-10-CM

## 2015-07-19 DIAGNOSIS — I10 Essential (primary) hypertension: Secondary | ICD-10-CM

## 2015-07-19 NOTE — Patient Instructions (Signed)
Your physician wants you to follow-up in: 1 Year. You will receive a reminder letter in the mail two months in advance. If you don't receive a letter, please call our office to schedule the follow-up appointment.  

## 2015-07-23 ENCOUNTER — Other Ambulatory Visit: Payer: Self-pay | Admitting: Cardiology

## 2015-07-24 ENCOUNTER — Encounter (HOSPITAL_COMMUNITY): Payer: Self-pay

## 2015-07-24 ENCOUNTER — Inpatient Hospital Stay (HOSPITAL_COMMUNITY)
Admission: EM | Admit: 2015-07-24 | Discharge: 2015-08-01 | DRG: 543 | Disposition: A | Discharge status: Skilled Nursing Facility | Payer: Medicare Other | Attending: Family Medicine | Admitting: Family Medicine

## 2015-07-24 ENCOUNTER — Inpatient Hospital Stay (HOSPITAL_COMMUNITY): Payer: Medicare Other

## 2015-07-24 ENCOUNTER — Emergency Department (HOSPITAL_COMMUNITY): Payer: Medicare Other

## 2015-07-24 DIAGNOSIS — M4856XA Collapsed vertebra, not elsewhere classified, lumbar region, initial encounter for fracture: Principal | ICD-10-CM | POA: Diagnosis present

## 2015-07-24 DIAGNOSIS — N183 Chronic kidney disease, stage 3 unspecified: Secondary | ICD-10-CM | POA: Insufficient documentation

## 2015-07-24 DIAGNOSIS — W19XXXD Unspecified fall, subsequent encounter: Secondary | ICD-10-CM | POA: Diagnosis not present

## 2015-07-24 DIAGNOSIS — N401 Enlarged prostate with lower urinary tract symptoms: Secondary | ICD-10-CM | POA: Diagnosis present

## 2015-07-24 DIAGNOSIS — G8929 Other chronic pain: Secondary | ICD-10-CM | POA: Diagnosis present

## 2015-07-24 DIAGNOSIS — N319 Neuromuscular dysfunction of bladder, unspecified: Secondary | ICD-10-CM | POA: Diagnosis present

## 2015-07-24 DIAGNOSIS — G959 Disease of spinal cord, unspecified: Secondary | ICD-10-CM | POA: Diagnosis present

## 2015-07-24 DIAGNOSIS — M898X9 Other specified disorders of bone, unspecified site: Secondary | ICD-10-CM | POA: Insufficient documentation

## 2015-07-24 DIAGNOSIS — E039 Hypothyroidism, unspecified: Secondary | ICD-10-CM | POA: Diagnosis present

## 2015-07-24 DIAGNOSIS — R630 Anorexia: Secondary | ICD-10-CM | POA: Diagnosis present

## 2015-07-24 DIAGNOSIS — W01198A Fall on same level from slipping, tripping and stumbling with subsequent striking against other object, initial encounter: Secondary | ICD-10-CM | POA: Diagnosis present

## 2015-07-24 DIAGNOSIS — Z936 Other artificial openings of urinary tract status: Secondary | ICD-10-CM | POA: Diagnosis not present

## 2015-07-24 DIAGNOSIS — W19XXXA Unspecified fall, initial encounter: Secondary | ICD-10-CM | POA: Diagnosis not present

## 2015-07-24 DIAGNOSIS — M8580 Other specified disorders of bone density and structure, unspecified site: Secondary | ICD-10-CM | POA: Diagnosis present

## 2015-07-24 DIAGNOSIS — M454 Ankylosing spondylitis of thoracic region: Secondary | ICD-10-CM | POA: Diagnosis present

## 2015-07-24 DIAGNOSIS — Z515 Encounter for palliative care: Secondary | ICD-10-CM | POA: Insufficient documentation

## 2015-07-24 DIAGNOSIS — Z79818 Long term (current) use of other agents affecting estrogen receptors and estrogen levels: Secondary | ICD-10-CM | POA: Diagnosis not present

## 2015-07-24 DIAGNOSIS — Z951 Presence of aortocoronary bypass graft: Secondary | ICD-10-CM

## 2015-07-24 DIAGNOSIS — F05 Delirium due to known physiological condition: Secondary | ICD-10-CM | POA: Diagnosis not present

## 2015-07-24 DIAGNOSIS — Z79899 Other long term (current) drug therapy: Secondary | ICD-10-CM | POA: Diagnosis not present

## 2015-07-24 DIAGNOSIS — M4807 Spinal stenosis, lumbosacral region: Secondary | ICD-10-CM | POA: Diagnosis present

## 2015-07-24 DIAGNOSIS — I2510 Atherosclerotic heart disease of native coronary artery without angina pectoris: Secondary | ICD-10-CM | POA: Diagnosis present

## 2015-07-24 DIAGNOSIS — Z609 Problem related to social environment, unspecified: Secondary | ICD-10-CM | POA: Insufficient documentation

## 2015-07-24 DIAGNOSIS — I129 Hypertensive chronic kidney disease with stage 1 through stage 4 chronic kidney disease, or unspecified chronic kidney disease: Secondary | ICD-10-CM | POA: Diagnosis present

## 2015-07-24 DIAGNOSIS — D72829 Elevated white blood cell count, unspecified: Secondary | ICD-10-CM | POA: Diagnosis not present

## 2015-07-24 DIAGNOSIS — Z7189 Other specified counseling: Secondary | ICD-10-CM | POA: Diagnosis not present

## 2015-07-24 DIAGNOSIS — R739 Hyperglycemia, unspecified: Secondary | ICD-10-CM | POA: Diagnosis not present

## 2015-07-24 DIAGNOSIS — Z7289 Other problems related to lifestyle: Secondary | ICD-10-CM | POA: Diagnosis not present

## 2015-07-24 DIAGNOSIS — M899 Disorder of bone, unspecified: Secondary | ICD-10-CM | POA: Diagnosis not present

## 2015-07-24 DIAGNOSIS — Z7982 Long term (current) use of aspirin: Secondary | ICD-10-CM | POA: Diagnosis not present

## 2015-07-24 DIAGNOSIS — C7951 Secondary malignant neoplasm of bone: Secondary | ICD-10-CM | POA: Diagnosis present

## 2015-07-24 DIAGNOSIS — N39498 Other specified urinary incontinence: Secondary | ICD-10-CM | POA: Diagnosis present

## 2015-07-24 DIAGNOSIS — M1990 Unspecified osteoarthritis, unspecified site: Secondary | ICD-10-CM | POA: Diagnosis present

## 2015-07-24 DIAGNOSIS — S3992XA Unspecified injury of lower back, initial encounter: Secondary | ICD-10-CM | POA: Insufficient documentation

## 2015-07-24 DIAGNOSIS — G9589 Other specified diseases of spinal cord: Secondary | ICD-10-CM | POA: Insufficient documentation

## 2015-07-24 DIAGNOSIS — S3992XD Unspecified injury of lower back, subsequent encounter: Secondary | ICD-10-CM | POA: Diagnosis not present

## 2015-07-24 DIAGNOSIS — E785 Hyperlipidemia, unspecified: Secondary | ICD-10-CM | POA: Diagnosis present

## 2015-07-24 DIAGNOSIS — R1310 Dysphagia, unspecified: Secondary | ICD-10-CM | POA: Diagnosis present

## 2015-07-24 DIAGNOSIS — M545 Low back pain: Secondary | ICD-10-CM | POA: Diagnosis not present

## 2015-07-24 DIAGNOSIS — R410 Disorientation, unspecified: Secondary | ICD-10-CM | POA: Insufficient documentation

## 2015-07-24 DIAGNOSIS — Y92000 Kitchen of unspecified non-institutional (private) residence as  the place of occurrence of the external cause: Secondary | ICD-10-CM | POA: Diagnosis not present

## 2015-07-24 DIAGNOSIS — N179 Acute kidney failure, unspecified: Secondary | ICD-10-CM | POA: Diagnosis present

## 2015-07-24 DIAGNOSIS — M549 Dorsalgia, unspecified: Secondary | ICD-10-CM | POA: Diagnosis present

## 2015-07-24 DIAGNOSIS — N289 Disorder of kidney and ureter, unspecified: Secondary | ICD-10-CM | POA: Diagnosis not present

## 2015-07-24 DIAGNOSIS — N400 Enlarged prostate without lower urinary tract symptoms: Secondary | ICD-10-CM | POA: Diagnosis present

## 2015-07-24 HISTORY — DX: Malignant (primary) neoplasm, unspecified: C80.1

## 2015-07-24 LAB — URINALYSIS, ROUTINE W REFLEX MICROSCOPIC
Bilirubin Urine: NEGATIVE
GLUCOSE, UA: NEGATIVE mg/dL
KETONES UR: NEGATIVE mg/dL
NITRITE: NEGATIVE
PH: 7.5 (ref 5.0–8.0)
Protein, ur: 100 mg/dL — AB
SPECIFIC GRAVITY, URINE: 1.02 (ref 1.005–1.030)

## 2015-07-24 LAB — CBC WITH DIFFERENTIAL/PLATELET
BASOS ABS: 0 10*3/uL (ref 0.0–0.1)
BASOS PCT: 0 %
Eosinophils Absolute: 0.2 10*3/uL (ref 0.0–0.7)
Eosinophils Relative: 1 %
HEMATOCRIT: 41.8 % (ref 39.0–52.0)
HEMOGLOBIN: 13.5 g/dL (ref 13.0–17.0)
LYMPHS PCT: 22 %
Lymphs Abs: 3.3 10*3/uL (ref 0.7–4.0)
MCH: 30.1 pg (ref 26.0–34.0)
MCHC: 32.3 g/dL (ref 30.0–36.0)
MCV: 93.3 fL (ref 78.0–100.0)
Monocytes Absolute: 1.4 10*3/uL — ABNORMAL HIGH (ref 0.1–1.0)
Monocytes Relative: 9 %
NEUTROS ABS: 10.4 10*3/uL — AB (ref 1.7–7.7)
NEUTROS PCT: 68 %
Platelets: 202 10*3/uL (ref 150–400)
RBC: 4.48 MIL/uL (ref 4.22–5.81)
RDW: 13.6 % (ref 11.5–15.5)
WBC: 15.4 10*3/uL — AB (ref 4.0–10.5)

## 2015-07-24 LAB — BASIC METABOLIC PANEL
ANION GAP: 11 (ref 5–15)
BUN: 37 mg/dL — AB (ref 6–20)
CO2: 22 mmol/L (ref 22–32)
Calcium: 9.4 mg/dL (ref 8.9–10.3)
Chloride: 107 mmol/L (ref 101–111)
Creatinine, Ser: 2.26 mg/dL — ABNORMAL HIGH (ref 0.61–1.24)
GFR calc Af Amer: 27 mL/min — ABNORMAL LOW (ref >60)
GFR, EST NON AFRICAN AMERICAN: 24 mL/min — AB (ref >60)
Glucose, Bld: 109 mg/dL — ABNORMAL HIGH (ref 65–99)
POTASSIUM: 4.8 mmol/L (ref 3.5–5.1)
SODIUM: 140 mmol/L (ref 135–145)

## 2015-07-24 LAB — URINE MICROSCOPIC-ADD ON: Squamous Epithelial / LPF: NONE SEEN

## 2015-07-24 MED ORDER — ACETAMINOPHEN 325 MG PO TABS: 650.0000 mg | ORAL_TABLET | Freq: Four times a day (QID) | ORAL | Status: DC | PRN

## 2015-07-24 MED ORDER — MORPHINE SULFATE (PF) 2 MG/ML IV SOLN
2.0000 mg | INTRAVENOUS | Status: DC | PRN
  Filled 2015-07-24: qty 1

## 2015-07-24 MED ORDER — LORATADINE 10 MG PO TABS
10.0000 mg | ORAL_TABLET | Freq: Every day | ORAL | Status: DC
  Administered 2015-07-28 – 2015-08-01 (×5): 10 mg via ORAL
  Filled 2015-07-24 (×8): qty 1

## 2015-07-24 MED ORDER — ONDANSETRON HCL 4 MG/2ML IJ SOLN
4.0000 mg | Freq: Once | INTRAMUSCULAR | Status: AC
  Administered 2015-07-24: 4 mg via INTRAVENOUS
  Filled 2015-07-24: qty 2

## 2015-07-24 MED ORDER — LORAZEPAM 2 MG/ML IJ SOLN
0.5000 mg | Freq: Once | INTRAMUSCULAR | Status: AC
  Administered 2015-07-24: 0.5 mg via INTRAVENOUS
  Filled 2015-07-24: qty 1

## 2015-07-24 MED ORDER — KETOROLAC TROMETHAMINE 15 MG/ML IJ SOLN: 15.0000 mg | Freq: Four times a day (QID) | INTRAMUSCULAR | Status: DC | PRN

## 2015-07-24 MED ORDER — METHOCARBAMOL 1000 MG/10ML IJ SOLN: 500.0000 mg | Freq: Four times a day (QID) | INTRAVENOUS | Status: DC | PRN

## 2015-07-24 MED ORDER — ENOXAPARIN SODIUM 30 MG/0.3ML ~~LOC~~ SOLN
30.0000 mg | SUBCUTANEOUS | Status: DC
  Administered 2015-07-24 – 2015-07-31 (×8): 30 mg via SUBCUTANEOUS
  Filled 2015-07-24 (×8): qty 0.3

## 2015-07-24 MED ORDER — FENTANYL CITRATE (PF) 100 MCG/2ML IJ SOLN
12.5000 ug | INTRAMUSCULAR | Status: DC | PRN
  Administered 2015-07-24 – 2015-07-28 (×26): 12.5 ug via INTRAVENOUS
  Filled 2015-07-24 (×26): qty 2

## 2015-07-24 MED ORDER — LEVOTHYROXINE SODIUM 75 MCG PO TABS
150.0000 ug | ORAL_TABLET | Freq: Every day | ORAL | Status: DC
  Administered 2015-07-25: 150 ug via ORAL
  Filled 2015-07-24 (×2): qty 2

## 2015-07-24 MED ORDER — KETOROLAC TROMETHAMINE 30 MG/ML IJ SOLN
15.0000 mg | Freq: Once | INTRAMUSCULAR | Status: AC
  Administered 2015-07-24: 15 mg via INTRAVENOUS
  Filled 2015-07-24: qty 1

## 2015-07-24 MED ORDER — SODIUM CHLORIDE 0.9 % IV SOLN
INTRAVENOUS | Status: DC
  Administered 2015-07-24: 09:00:00 via INTRAVENOUS

## 2015-07-24 MED ORDER — ATENOLOL 25 MG PO TABS
25.0000 mg | ORAL_TABLET | Freq: Every day | ORAL | Status: DC
  Administered 2015-07-24: 25 mg via ORAL
  Filled 2015-07-24 (×4): qty 1

## 2015-07-24 MED ORDER — BACLOFEN 10 MG PO TABS
5.0000 mg | ORAL_TABLET | Freq: Three times a day (TID) | ORAL | Status: DC
  Administered 2015-07-24 (×2): 5 mg via ORAL
  Filled 2015-07-24 (×2): qty 1

## 2015-07-24 MED ORDER — ATORVASTATIN CALCIUM 10 MG PO TABS
10.0000 mg | ORAL_TABLET | Freq: Every day | ORAL | Status: DC
  Administered 2015-07-24 – 2015-07-31 (×6): 10 mg via ORAL
  Filled 2015-07-24 (×6): qty 1

## 2015-07-24 MED ORDER — MEGESTROL ACETATE 400 MG/10ML PO SUSP
120.0000 mg | Freq: Two times a day (BID) | ORAL | Status: DC
  Administered 2015-07-24 – 2015-08-01 (×10): 120 mg via ORAL
  Filled 2015-07-24 (×17): qty 5

## 2015-07-24 MED ORDER — ACETAMINOPHEN 650 MG RE SUPP: 650.0000 mg | Freq: Four times a day (QID) | RECTAL | Status: DC | PRN

## 2015-07-24 MED ORDER — ASPIRIN EC 81 MG PO TBEC
81.0000 mg | DELAYED_RELEASE_TABLET | Freq: Two times a day (BID) | ORAL | Status: DC
  Administered 2015-07-24 – 2015-08-01 (×11): 81 mg via ORAL
  Filled 2015-07-24 (×14): qty 1

## 2015-07-24 MED ORDER — MEGESTROL ACETATE 400 MG/10ML PO SUSP: 400.0000 mg | Freq: Two times a day (BID) | ORAL | Status: DC

## 2015-07-24 MED ORDER — SODIUM CHLORIDE 0.9 % IV SOLN
INTRAVENOUS | Status: AC
  Administered 2015-07-24: 16:00:00 via INTRAVENOUS

## 2015-07-24 MED ORDER — FENTANYL CITRATE (PF) 100 MCG/2ML IJ SOLN
50.0000 ug | INTRAMUSCULAR | Status: DC | PRN
  Administered 2015-07-24: 50 ug via INTRAVENOUS
  Filled 2015-07-24: qty 2

## 2015-07-24 MED ORDER — MORPHINE SULFATE (PF) 4 MG/ML IV SOLN
4.0000 mg | Freq: Once | INTRAVENOUS | Status: AC
  Administered 2015-07-24: 4 mg via INTRAVENOUS
  Filled 2015-07-24: qty 1

## 2015-07-24 NOTE — ED Notes (Signed)
Attempted report x1. 

## 2015-07-24 NOTE — ED Provider Notes (Signed)
CSN: [id number]     Arrival date & time 07/24/15  0825 History   First MD Initiated Contact with Patient 07/24/15 0827     Chief Complaint  Patient presents with  . Fall  . Back Pain     (Consider location/radiation/quality/duration/timing/severity/associated sxs/prior Treatment) HPI    James Small is a 82 y.o. male  Who presents for evaluation of fall. Injury was yesterday. He injured his back but is unable to specify where.  Level V caveat- poor historian   Past Medical History  Diagnosis Date  . Inguinal hernia     left  . Thyroid disease   . Dyslipidemia   . BPH (benign prostatic hyperplasia)   . Coronary artery disease     status post cabg in 1996 by Prescott Gum with LIMA to the LAD, SVG to D1 and D2, SVG to circumflex, SVG to right coronary artery  . Hypertension   . Hypothyroidism   . Chronic kidney disease   . Presence of indwelling urinary catheter   . Weight loss     25 lbs over last few months per pt   . Arthritis     lower back - pt has epidural injections every few months    Past Surgical History  Procedure Laterality Date  . Coronary artery bypass graft      1996  . Cyst on necksurgery      Family History  Problem Relation Age of Onset  . Family history unknown: Yes   Social History  Substance Use Topics  . Smoking status: Never Smoker   . Smokeless tobacco: Never Used  . Alcohol Use: No    Review of Systems  Unable to perform ROS: Mental status change      Allergies  Review of patient's allergies indicates no known allergies.  Home Medications   Prior to Admission medications   Medication Sig Start Date End Date Taking? Authorizing Provider  aspirin EC 81 MG tablet Take 81 mg by mouth 2 (two) times daily.   Yes Historical Provider, MD  atenolol (TENORMIN) 50 MG tablet TAKE 1/2 TABLET BY MOUTH DAILY 03/01/15  Yes Minus Breeding, MD  atorvastatin (LIPITOR) 10 MG tablet Take 10 mg by mouth at bedtime.    Yes Historical Provider, MD   cetirizine (ZYRTEC) 10 MG tablet Take 10 mg by mouth daily.   Yes Historical Provider, MD  ibuprofen (ADVIL,MOTRIN) 200 MG tablet Take 800 mg by mouth every 6 (six) hours as needed for mild pain.   Yes Historical Provider, MD  levothyroxine (SYNTHROID, LEVOTHROID) 150 MCG tablet Take 150 mcg by mouth daily before breakfast.   Yes Historical Provider, MD  megestrol (MEGACE) 400 MG/10ML suspension Take by mouth 2 (two) times daily.   Yes Historical Provider, MD  Multiple Vitamins-Minerals (MULTI COMPLETE PO) Take 1 tablet by mouth daily.    Yes Historical Provider, MD   BP 138/75 mmHg  Pulse 90  Temp(Src) 98.3 F (36.8 C) (Oral)  Resp 16  SpO2 96% Physical Exam  Constitutional: He is oriented to person, place, and time. He appears well-developed. He appears distressed (Uncomfortable).  Elderly, frail  HENT:  Head: Normocephalic and atraumatic.  Right Ear: External ear normal.  Left Ear: External ear normal.  Eyes: Conjunctivae and EOM are normal. Pupils are equal, round, and reactive to light.  Neck: Normal range of motion and phonation normal. Neck supple.  Cardiovascular: Normal rate, regular rhythm and normal heart sounds.   Pulmonary/Chest: Effort normal and breath  sounds normal. He exhibits no bony tenderness.  Abdominal: Soft. There is no tenderness.  Musculoskeletal:  Limited active range of motion of the entire spine secondary to pain. Intermittently cries out in pain because of stated back pain. On visualization and palpitation, no identified. Localized injury of the spine. No tenderness of the paraspinal musculature. No contusions or abrasions of the back. No visual or palpable deformity of the arms or legs.  Neurological: He is alert and oriented to person, place, and time. No cranial nerve deficit or sensory deficit. He exhibits normal muscle tone. Coordination normal.  Skin: Skin is warm, dry and intact.  Grade 1 erythema, sacral region consistent with early pressure sore.   Psychiatric: He has a normal mood and affect. His behavior is normal. Judgment and thought content normal.  Nursing note and vitals reviewed.   ED Course  Procedures (including critical care time)  Medications  0.9 %  sodium chloride infusion ( Intravenous New Bag/Given 07/24/15 0847)  ondansetron (ZOFRAN) injection 4 mg (4 mg Intravenous Given 07/24/15 0846)  morphine 4 MG/ML injection 4 mg (4 mg Intravenous Given 07/24/15 0846)  ketorolac (TORADOL) 30 MG/ML injection 15 mg (15 mg Intravenous Given 07/24/15 1140)  LORazepam (ATIVAN) injection 0.5 mg (0.5 mg Intravenous Given 07/24/15 1138)    Patient Vitals for the past 24 hrs:  BP Temp Temp src Pulse Resp SpO2  07/24/15 1000 138/75 mmHg - - 90 16 96 %  07/24/15 0825 152/89 mmHg 98.3 F (36.8 C) Oral 93 17 100 %    11:37 AM Reevaluation with update and discussion. After initial assessment and treatment, an updated evaluation reveals Patient remains uncomfortable and intermittently crying out in pain. He did get some transient relief with the morphine. Additional medication ordered. Family members here now and states that he fell likely because he tripped on his pants leg. He does have a history of back pain, but not like he currently is experiencing. There been no other symptoms. He was able to get up after the fall yesterday, but his symptoms worsened, so he came here for evaluation. He typically walks with a walker. He has a history of "ruptured disc". He has been seeing his primary care doctor recently had blood work done at that office. James Small    Records from family members indicate recent labs, 06/26/2015. These show elevated white blood cell count at 10.9, with normal differential. Creatinine elevated 2.1. Sodium and chloride mildly high 146 and 109.  11:41 AM-Consult complete with on-call resident. Patient case explained and discussed. He agrees to admit patient for further evaluation and treatment. Call ended at 11:50  a.m.   Labs Review Labs Reviewed  BASIC METABOLIC PANEL - Abnormal; Notable for the following:    Glucose, Bld 109 (*)    BUN 37 (*)    Creatinine, Ser 2.26 (*)    GFR calc non Af Amer 24 (*)    GFR calc Af Amer 27 (*)    All other components within normal limits  CBC WITH DIFFERENTIAL/PLATELET - Abnormal; Notable for the following:    WBC 15.4 (*)    Neutro Abs 10.4 (*)    Monocytes Absolute 1.4 (*)    All other components within normal limits  URINALYSIS, ROUTINE W REFLEX MICROSCOPIC (NOT AT [facility]) - Abnormal; Notable for the following:    APPearance TURBID (*)    Hgb urine dipstick SMALL (*)    Protein, ur 100 (*)    Leukocytes, UA MODERATE (*)  All other components within normal limits  URINE MICROSCOPIC-ADD ON - Abnormal; Notable for the following:    Bacteria, UA MANY (*)    Crystals TRIPLE PHOSPHATE CRYSTALS (*)    All other components within normal limits    Imaging Review Dg Thoracic Spine 2 View  07/24/2015  CLINICAL DATA:  Fall. EXAM: THORACIC SPINE 2 VIEWS COMPARISON:  10/29/2011. FINDINGS: Prior CABG. Mild scoliosis concave left. Diffuse osteopenia degenerative change. Changes of ankylosing spondylitis noted. No acute abnormality . IMPRESSION: 1. Diffuse osteopenia and degenerative change. Changes of ankylosing spondylitis noted. 2. No acute abnormality . Electronically Signed   By: Marcello Moores  Register   On: 07/24/2015 09:15   Dg Lumbar Spine Complete  07/24/2015  CLINICAL DATA:  Fall.  Initial evaluation. EXAM: LUMBAR SPINE - COMPLETE 4+ VIEW COMPARISON:  MRI 03/07/2010. FINDINGS: Diffuse multilevel degenerative change and changes of ankylosing spondylitis noted. No acute bony abnormality identified. No evidence of fracture. Aortoiliac atherosclerotic vascular calcification. IMPRESSION: 1. Diffuse multilevel degenerative change. Changes of ankylosing spondylitis. No acute bony abnormality identified. 2. Aortoiliac atherosclerotic vascular calcification . Electronically  Signed   By: Marcello Moores  Register   On: 07/24/2015 09:17   I have personally reviewed and evaluated these images and lab results as part of my medical decision-making.   EKG Interpretation None      MDM   Final diagnoses:  Back injuries, initial encounter  Fall, initial encounter  Renal insufficiency    Fall with back injury. Etiology of fall is not clear in this patient was immunized with a walker, and is deconditioned. Abnormal urinalysis, he is chronically catheterized with a suprapubic catheter. White blood cell count elevated from baseline. Creatinine is slightly higher than baseline. Doubt sepsis, metabolic instability or impending vascular collapse. No evidence for spinal myelopathy. Initial images of back with plain films, were negative for fracture. He does have moderate multifactorial arthritis. Patient is extremely uncomfortable and required admission for pain control and stabilization.  Nursing Notes Reviewed/ Care Coordinated Applicable Imaging Reviewed Interpretation of Laboratory Data incorporated into ED treatment  Plan: Admit    Daleen Bo, MD 07/24/15 1156

## 2015-07-24 NOTE — H&P (Signed)
[facility] Admission History and Physical Service Pager: [phone]  Patient name: James Small Medical record number: [id number] Date of birth: 14-Aug-1922 Age: 82 y.o. Gender: male  Primary Care Provider: Limmie Patricia, MD Consultants: None Code Status: Full (confirmed on admission)  Chief Complaint:   Assessment and Plan: James Small is a 82 y.o. male presenting with back pain x 1 day after fall. 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  # Back pain: Worsened since fall 6/11. Xray lumbar and thoracic spine showed changes consistent with ankylosing spondylitis (Denies previous history). No fractures noted. Diffuse osteopenia and degenerative changes of thoracic spine. 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 No radiculopathy on exam. No loss of bowel function.  No saddle anesthesia. - Admit to inpatient med-surg, attending Dr. Andria Small, for pain management and further workup - Will obtain MRI lumbar and thoracic spine without contrast to better evaluate inflammation and to r/o involvement of previously noted intrathecal mass - Fentanyl 12.5 mcg q2h prn for pain, dose decreased from initial 45m due to somnolence.  - Will avoid NSAID use at this time due to renal function. - Baclofen 5 mg TID prn for muscle spasm - Place PT/OT consult tomorrow, 6/13  # AKI in setting of CKD: Baseline SCr ~ 2.0 (according to labs with SCr 2.07 from 06/26/15). Daughter says renal function has been stable with indwelling suprapubic cath. May be due to dehydration from decreased PO 2/2 pain, but BUN:Cr ratio < 20 and patient did have breakfast this morning.  - Avoid nephrotoxic medications, including NSAIDs - Continue NS_0 /hr while pain limits adequate PO intake. Discontinue once pain is better  controlled and patient able to sit up and eat/drink. - AM BMP  # Leukocytosis: WBC 15.4 on admission, previously elevated to 10.9 with last labs 06/26/15. UA with leukocytosis and micro with bacteria in setting of indwelling cath. Suspect chronic colonization. McGeer's Criteria not met, with patient afebrile without rigors or hypotension, no changes in mental status, and no purulent discharge.  - Continue to monitor daily CBCs.   - Would not get urine culture or consider treatment of UTI unless patient develops fever or has worsening leukocytosis - Antibiotics not indicated, consider if clinically changes  # BPH and neurogenic bladder: Not medically managed. Has indwelling catheter.  - Flush catheter three times weekly - Change suprapubic catheter if not discharged by 6/15  # CAD s/p CABG in 1996: Follows with Dr. HPercival Small Seen 07/18/15 and no medication changes suggested. Myoview from 04/2013 showed no high risk findings.  - Continue home aspirin 81 mg BID - Continue home atorvastatin 10 mg QHS - Consider cardiac imaging given increased risk of cardiovascular disease with Ankylosing spondylitis.  # Hypothyroidism: - Continue home synthroid 150 mcg with breakfast  # Poor appetite: - Continue home megace 3 mL BID  FEN/GI: Heart healthy diet, NS @ 100 mL/hr  Prophylaxis: Lovenox (Renally Dosed)  Disposition: Admitted for pain management. Anticipate discharge in next 1-2 days, pending improvement in mobility.   History of Present Illness:  James TONEYis a 82y.o. male with hx of herniated disc and thecal mass brought in by EMS complaining of back pain after fall 1 day ago. Pt was in kitchen cooking and turned but his foot got caught on his pants causing him lose balance. He  fell backwards into the refrigerator, hitting his back. He reports that he did not hit his head or have LOC. Pt was able to call his son after the fall, and upon his son's arrival, patient was in the fetal position on  the kitchen floor because he was not able to get up without assistance. Since the fall pt reports that he has had back spasms, and at 6:30 this AM he told his son that he needed to come to hospital. He tried to sleep in a chair overnight but could not due to pain. He was able to walk from his chair to kitchen table with his walker this morning but had significant back pain. He reports taking 2 ibuprofen twice prior to arrival without relief. Daughter denies hearing the term "anklyosing spondylitis" before and knows he has been treated with steroid injections 3 times before for slipped discs. She says he is very careful with his movements to avoid flaring up pain, as he would like to avoid getting more steroid injections (has not had in about 3 years). Does not recall being told about a thecal mass. He denies fever, abdominal pain, saddle anesthesia, urinary or bowel incontinence, radiation of pain to lower extremities or any other symptoms. Pt's catheter is flushed by son weekly. Pt has appointment this Thursday for catheter change, and it was last changed 3-4 weeks. Pt lives alone and at baseline pt dresses himself, walks with walker, pays his bills, and manages his medications. Ambulates with walker. Does not cook his own meals--his son drops off meals for him to eat. Widowed. Nonsmoker.  Meds: zyrtec, atenolol, asa, megesterol 57m BID for appetite.  In the ED, he was given IV morphine 4 mg, IV zofran 4 mg, and IV toradol 15 mg. He denies pain currently without movement.   Review Of Systems: Per HPI with the following additions: No  Otherwise the remainder of the systems were negative.  Patient Active Problem List   Diagnosis Date Noted  . Back injury 07/24/2015  . ESSENTIAL HYPERTENSION, BENIGN 12/06/2008  . HYPOTHYROIDISM 12/01/2008  . DYSLIPIDEMIA 12/01/2008  . Coronary atherosclerosis 12/01/2008   Past Medical History: Past Medical History  Diagnosis Date  . Inguinal hernia     left  .  Thyroid disease   . Dyslipidemia   . BPH (benign prostatic hyperplasia)   . Coronary artery disease     status post cabg in 1996 by VPrescott Gumwith LIMA to the LAD, SVG to D1 and D2, SVG to circumflex, SVG to right coronary artery  . Hypertension   . Hypothyroidism   . Chronic kidney disease   . Presence of indwelling urinary catheter   . Weight loss     25 lbs over last few months per pt   . Arthritis     lower back - pt has epidural injections every few months     Past Surgical History: Past Surgical History  Procedure Laterality Date  . Coronary artery bypass graft      1996  . Cyst on necksurgery       Social History: Social History  Substance Use Topics  . Smoking status: Never Smoker   . Smokeless tobacco: Never Used  . Alcohol Use: No   Additional social history: Lives alone but near his children who help him get groceries Please also refer to relevant sections of EMR.  Family History: Family History  Problem Relation Age of Onset  . Family history unknown: Yes   Allergies and  Medications: No Known Allergies No current facility-administered medications on file prior to encounter.   Current Outpatient Prescriptions on File Prior to Encounter  Medication Sig Dispense Refill  . aspirin EC 81 MG tablet Take 81 mg by mouth 2 (two) times daily.    Marland Kitchen atenolol (TENORMIN) 50 MG tablet TAKE 1/2 TABLET BY MOUTH DAILY 45 tablet 1  . atorvastatin (LIPITOR) 10 MG tablet Take 10 mg by mouth at bedtime.     . cetirizine (ZYRTEC) 10 MG tablet Take 10 mg by mouth daily.    Marland Kitchen levothyroxine (SYNTHROID, LEVOTHROID) 150 MCG tablet Take 150 mcg by mouth daily before breakfast.    . megestrol (MEGACE) 400 MG/10ML suspension Take by mouth 2 (two) times daily.    . Multiple Vitamins-Minerals (MULTI COMPLETE PO) Take 1 tablet by mouth daily.       Objective: BP 138/75 mmHg  Pulse 90  Temp(Src) 98.3 F (36.8 C) (Oral)  Resp 16  SpO2 96% Exam: General: Elderly male, lying in bed  in mild distress Eyes: PERRLA, EOMI ENTM: MM slightly tacky, oropharynx normal Neck: Some decreased ROM with lateral movement Cardiovascular: RRR, S1, S2, no m/r/g Respiratory: CTAB, no increased WOB Abdomen: +BS, S, mild TTP of RUQ, ND, suprapubic catheter in place MSK: 4/5 lower extremity strength bilaterally with straight leg raise (this did not elicit pain), TTP over low thoracic/upper lumbar spine that extends a few cm laterally, no ecchymosis noted; moaned in pain with lateral movement Skin: 3 cm healing laceration over right elbow Neuro: AOx3, CNII-XII grossly intact, speech somewhat slurred after receiving pain medications, answering questions appropriately Psych: Appropriate mood and affect  Labs and Imaging: CBC BMET   Recent Labs Lab 07/24/15 0842  WBC 15.4*  HGB 13.5  HCT 41.8  PLT 202    Recent Labs Lab 07/24/15 0842  NA 140  K 4.8  CL 107  CO2 22  BUN 37*  CREATININE 2.26*  GLUCOSE 109*  CALCIUM 9.4     Urinalysis    Component Value Date/Time   COLORURINE YELLOW 07/24/2015 0845   APPEARANCEUR TURBID* 07/24/2015 0845   LABSPEC 1.020 07/24/2015 0845   PHURINE 7.5 07/24/2015 0845   GLUCOSEU NEGATIVE 07/24/2015 0845   HGBUR SMALL* 07/24/2015 0845   BILIRUBINUR NEGATIVE 07/24/2015 0845   KETONESUR NEGATIVE 07/24/2015 0845   PROTEINUR 100* 07/24/2015 0845   UROBILINOGEN 0.2 11/19/2014 2037   NITRITE NEGATIVE 07/24/2015 0845   LEUKOCYTESUR MODERATE* 07/24/2015 0845   Dg Thoracic Spine 2 View  07/24/2015  CLINICAL DATA:  Fall. EXAM: THORACIC SPINE 2 VIEWS COMPARISON:  10/29/2011. FINDINGS: Prior CABG. Mild scoliosis concave left. Diffuse osteopenia degenerative change. Changes of ankylosing spondylitis noted. No acute abnormality . IMPRESSION: 1. Diffuse osteopenia and degenerative change. Changes of ankylosing spondylitis noted. 2. No acute abnormality . Electronically Signed   By: Marcello Moores  Register   On: 07/24/2015 09:15   Dg Lumbar Spine  Complete  07/24/2015  CLINICAL DATA:  Fall.  Initial evaluation. EXAM: LUMBAR SPINE - COMPLETE 4+ VIEW COMPARISON:  MRI 03/07/2010. FINDINGS: Diffuse multilevel degenerative change and changes of ankylosing spondylitis noted. No acute bony abnormality identified. No evidence of fracture. Aortoiliac atherosclerotic vascular calcification. IMPRESSION: 1. Diffuse multilevel degenerative change. Changes of ankylosing spondylitis. No acute bony abnormality identified. 2. Aortoiliac atherosclerotic vascular calcification . Electronically Signed   By: Marcello Moores  Register   On: 07/24/2015 09:17   Hillary Corinda Gubler, MD 07/24/2015, 12:15 PM PGY-1, [place] Intern pager: [phone], text pages welcome  Upper Level Addendum:  I have seen and evaluated this patient along with Dr. Cyndia Skeeters and reviewed the above note, making necessary revisions in blue.   Dr. Junie Panning, DO, PGY2 07/24/2015; 4:13 PM

## 2015-07-24 NOTE — ED Notes (Signed)
Patient moved physically from hallway to room A9

## 2015-07-24 NOTE — Telephone Encounter (Signed)
Rx request sent to pharmacy.  

## 2015-07-24 NOTE — Care Management Note (Signed)
Case Management Note  Patient Details  Name: James Small MRN: [id number] Date of Birth: 1922-04-03  Subjective/Objective:                  82 y.o. male with hx of herniated disc BIB EMS complaining of back pain after fall 1 day ago. Pt was in kitchen cooking and turned but his foot was caught on the kitchen rug causing him lose balance and fell backwards into the refrigerator, hitting his back. He reports that he did not hit his head or have LOC./ Home alone.  Action/Plan: Follow for disposition needs. /Home health vs home alone.   Expected Discharge Date:  07/26/15               Expected Discharge Plan:  [place]  In-House Referral:     Discharge planning Services  CM Consult  Post Acute Care Choice:    Choice offered to:     DME Arranged:    DME Agency:     HH Arranged:    [place] Agency:     Status of Service:     Medicare Important Message Given:    Date Medicare IM Given:    Medicare IM give by:    Date Additional Medicare IM Given:    Additional Medicare Important Message give by:     If discussed at [place] of Stay Meetings, dates discussed:    Additional Comments:  Fuller Mandril, RN 07/24/2015, 1:49 PM

## 2015-07-24 NOTE — ED Notes (Signed)
Myself, Chelsea, RN and Mason, RN undressed patient and placed him in a gown, on continuous pulse oximetry and blood pressure cuff

## 2015-07-24 NOTE — ED Notes (Signed)
Attempted report x 2 

## 2015-07-24 NOTE — ED Notes (Signed)
Per EMS - pt from home, lives alone. Fell yesterday and hit back against refrigerator. Slept in chair last night, pain in back became more severe overnight. Pt son came to see pt and called EMS this morning. No LOC. Pain with small movements. Pt normally walks w/ walker.

## 2015-07-25 ENCOUNTER — Encounter (HOSPITAL_COMMUNITY): Payer: Self-pay | Admitting: General Practice

## 2015-07-25 DIAGNOSIS — M899 Disorder of bone, unspecified: Secondary | ICD-10-CM

## 2015-07-25 DIAGNOSIS — G959 Disease of spinal cord, unspecified: Secondary | ICD-10-CM

## 2015-07-25 DIAGNOSIS — M898X9 Other specified disorders of bone, unspecified site: Secondary | ICD-10-CM | POA: Insufficient documentation

## 2015-07-25 DIAGNOSIS — C7951 Secondary malignant neoplasm of bone: Secondary | ICD-10-CM | POA: Insufficient documentation

## 2015-07-25 DIAGNOSIS — G9589 Other specified diseases of spinal cord: Secondary | ICD-10-CM | POA: Insufficient documentation

## 2015-07-25 DIAGNOSIS — M545 Low back pain: Secondary | ICD-10-CM

## 2015-07-25 DIAGNOSIS — W19XXXD Unspecified fall, subsequent encounter: Secondary | ICD-10-CM

## 2015-07-25 LAB — CBC
HCT: 39 % (ref 39.0–52.0)
Hemoglobin: 12.3 g/dL — ABNORMAL LOW (ref 13.0–17.0)
MCH: 30.1 pg (ref 26.0–34.0)
MCHC: 31.5 g/dL (ref 30.0–36.0)
MCV: 95.4 fL (ref 78.0–100.0)
PLATELETS: 166 10*3/uL (ref 150–400)
RBC: 4.09 MIL/uL — ABNORMAL LOW (ref 4.22–5.81)
RDW: 13.7 % (ref 11.5–15.5)
WBC: 13.8 10*3/uL — ABNORMAL HIGH (ref 4.0–10.5)

## 2015-07-25 LAB — BASIC METABOLIC PANEL
Anion gap: 8 (ref 5–15)
BUN: 30 mg/dL — AB (ref 6–20)
CALCIUM: 8.8 mg/dL — AB (ref 8.9–10.3)
CO2: 23 mmol/L (ref 22–32)
Chloride: 110 mmol/L (ref 101–111)
Creatinine, Ser: 2.14 mg/dL — ABNORMAL HIGH (ref 0.61–1.24)
GFR calc Af Amer: 29 mL/min — ABNORMAL LOW (ref >60)
GFR, EST NON AFRICAN AMERICAN: 25 mL/min — AB (ref >60)
GLUCOSE: 124 mg/dL — AB (ref 65–99)
POTASSIUM: 5 mmol/L (ref 3.5–5.1)
Sodium: 141 mmol/L (ref 135–145)

## 2015-07-25 LAB — PSA: PSA: 129.23 ng/mL — ABNORMAL HIGH (ref 0.00–4.00)

## 2015-07-25 MED ORDER — DEXTROSE-NACL 5-0.9 % IV SOLN
INTRAVENOUS | Status: DC
  Administered 2015-07-25 – 2015-07-27 (×6): via INTRAVENOUS

## 2015-07-25 MED ORDER — PREGABALIN 25 MG PO CAPS
25.0000 mg | ORAL_CAPSULE | Freq: Every day | ORAL | Status: DC
  Administered 2015-07-28 – 2015-08-01 (×5): 25 mg via ORAL
  Filled 2015-07-25 (×7): qty 1

## 2015-07-25 MED ORDER — LIDOCAINE 5 % EX PTCH
1.0000 | MEDICATED_PATCH | CUTANEOUS | Status: DC
  Administered 2015-07-25 – 2015-08-01 (×8): 1 via TRANSDERMAL
  Filled 2015-07-25 (×8): qty 1

## 2015-07-25 MED ORDER — BACLOFEN 10 MG PO TABS
5.0000 mg | ORAL_TABLET | Freq: Three times a day (TID) | ORAL | Status: DC | PRN
  Administered 2015-07-25 – 2015-07-30 (×2): 5 mg via ORAL
  Filled 2015-07-25 (×3): qty 1

## 2015-07-25 MED ORDER — HALOPERIDOL LACTATE 5 MG/ML IJ SOLN
1.0000 mg | Freq: Four times a day (QID) | INTRAMUSCULAR | Status: DC | PRN
  Administered 2015-07-25 – 2015-07-26 (×2): 1 mg via INTRAVENOUS
  Filled 2015-07-25 (×2): qty 1

## 2015-07-25 MED ORDER — DEXAMETHASONE SODIUM PHOSPHATE 10 MG/ML IJ SOLN
10.0000 mg | Freq: Four times a day (QID) | INTRAMUSCULAR | Status: DC
  Administered 2015-07-25 – 2015-07-30 (×21): 10 mg via INTRAVENOUS
  Filled 2015-07-25 (×21): qty 1

## 2015-07-25 MED ORDER — FENTANYL CITRATE (PF) 100 MCG/2ML IJ SOLN
12.5000 ug | INTRAMUSCULAR | Status: AC
Start: 2015-07-25 — End: 2015-07-25
  Administered 2015-07-25: 12.5 ug via INTRAVENOUS
  Filled 2015-07-25: qty 2

## 2015-07-25 NOTE — Progress Notes (Signed)
OT Cancellation Note  Patient Details Name: James Small MRN: [id number] DOB: [birth year]   Cancelled Treatment:    Reason Eval/Treat Not Completed: Other (comment). Heard pt hollaring out in room, noted RN light on in room went in to check and pt hollaring out with any movement, but staff needed to reposition him to try and prevent bed sores. Assisted staff getting pt up in bed turned on his left side. Will re-attempt eval at a later date as appropriate.  Almon Register [id number] 07/25/2015, 1:24 PM

## 2015-07-25 NOTE — Clinical Documentation Improvement (Signed)
Family Medicine Please update your documentation within the medical record to reflect your response to this query. Thank you  Can the diagnosis of CKD be further specified with stage?   CKD Stage I - GFR greater than or equal to 90  CKD Stage II - GFR 60-89  CKD Stage III - GFR 30-59  CKD Stage IV - GFR 15-29  CKD Stage V - GFR < 15  ESRD (End Stage Renal Disease)  Other condition  Unable to clinically determine  Supporting Information: : (risk factors, signs and symptoms, diagnostics, treatment) 07/25/15 progr note..."# AKI in setting of CKD: Improving. 2.14 < 2.26. Baseline SCr ~ 2.0 (according to labs with SCr 2.07 from 06/26/15)."..."- Avoid nephrotoxic medications, including NSAIDs- Start D5NS@125cc /hr while pain limits adequate PO intake..."  Please exercise your independent, professional judgment when responding. A specific answer is not anticipated or expected.  Thank You, Ermelinda Das, RN, BSN, [place] Certified Clinical Documentation Specialist : Health Information Management [phone]

## 2015-07-25 NOTE — Consult Note (Signed)
Reason for Consult: Back pain Referring Physician: Family medicine teaching service  James Small is an 82 y.o. male.  HPI: 82 year old male admitted following a mechanical fall at home. Patient with intense upper lumbar pain without obvious radiation. 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 Patient declined surgery at that time. Patient also with significant long-standing lumbar stenosis at L4-5 and L5-S1. Patient chronically limited by back pain with some lower extremity symptoms. Patient with chronic urinary incontinence status post placement of suprapubic catheter. Patient has been suffering with severe back pain and spasms. No new obvious complaints of sensory loss or weakness. Workup also demonstrates metastatic disease extensively through multiple vertebral bodies and paravertebral in his thoracic spine and L1 vertebral body. No known history of primary cancer.  Past Medical History  Diagnosis Date  . Inguinal hernia     left  . Thyroid disease   . Dyslipidemia   . BPH (benign prostatic hyperplasia)   . Coronary artery disease     status post cabg in 1996 by [place] Gum with LIMA to the LAD, SVG to D1 and D2, SVG to circumflex, SVG to right coronary artery  . Hypertension   . Hypothyroidism   . Chronic kidney disease   . Presence of indwelling urinary catheter   . Weight loss     25 lbs over last few months per pt   . Arthritis     lower back - pt has epidural injections every few months   . Cancer [facility])     metasatic / spine     Past Surgical History  Procedure Laterality Date  . Coronary artery bypass graft      1996  . Cyst on necksurgery       Family History  Problem Relation Age of Onset  . Family history unknown: Yes    Social History:  reports that he has never smoked. He has never used smokeless tobacco. He reports that he does not drink alcohol or use illicit  drugs.  Allergies: No Known Allergies  Medications: I have reviewed the patient's current medications.  Results for orders placed or performed during the hospital encounter of 07/24/15 (from the past 48 hour(s))  Basic metabolic panel     Status: Abnormal   Collection Time: 07/24/15  8:42 AM  Result Value Ref Range   Sodium 140 135 - 145 mmol/L   Potassium 4.8 3.5 - 5.1 mmol/L   Chloride 107 101 - 111 mmol/L   CO2 22 22 - 32 mmol/L   Glucose, Bld 109 (H) 65 - 99 mg/dL   BUN 37 (H) 6 - 20 mg/dL   Creatinine, Ser 2.26 (H) 0.61 - 1.24 mg/dL   Calcium 9.4 8.9 - 10.3 mg/dL   GFR calc non Af Amer 24 (L) >60 mL/min   GFR calc Af Amer 27 (L) >60 mL/min    Comment: (NOTE) The eGFR has been calculated using the CKD EPI equation. This calculation has not been validated in all clinical situations. eGFR's persistently <60 mL/min signify possible Chronic Kidney Disease.    Anion gap 11 5 - 15  CBC with Differential     Status: Abnormal   Collection Time: 07/24/15  8:42 AM  Result Value Ref Range   WBC 15.4 (H) 4.0 - 10.5 K/uL   RBC 4.48 4.22 - 5.81 MIL/uL   Hemoglobin 13.5 13.0 - 17.0 g/dL   HCT 41.8 39.0 - 52.0 %  MCV 93.3 78.0 - 100.0 fL   MCH 30.1 26.0 - 34.0 pg   MCHC 32.3 30.0 - 36.0 g/dL   RDW 13.6 11.5 - 15.5 %   Platelets 202 150 - 400 K/uL   Neutrophils Relative % 68 %   Neutro Abs 10.4 (H) 1.7 - 7.7 K/uL   Lymphocytes Relative 22 %   Lymphs Abs 3.3 0.7 - 4.0 K/uL   Monocytes Relative 9 %   Monocytes Absolute 1.4 (H) 0.1 - 1.0 K/uL   Eosinophils Relative 1 %   Eosinophils Absolute 0.2 0.0 - 0.7 K/uL   Basophils Relative 0 %   Basophils Absolute 0.0 0.0 - 0.1 K/uL  Urinalysis, Routine w reflex microscopic     Status: Abnormal   Collection Time: 07/24/15  8:45 AM  Result Value Ref Range   Color, Urine YELLOW YELLOW   APPearance TURBID (A) CLEAR   Specific Gravity, Urine 1.020 1.005 - 1.030   pH 7.5 5.0 - 8.0   Glucose, UA NEGATIVE NEGATIVE mg/dL   Hgb urine dipstick  SMALL (A) NEGATIVE   Bilirubin Urine NEGATIVE NEGATIVE   Ketones, ur NEGATIVE NEGATIVE mg/dL   Protein, ur 100 (A) NEGATIVE mg/dL   Nitrite NEGATIVE NEGATIVE   Leukocytes, UA MODERATE (A) NEGATIVE  Urine microscopic-add on     Status: Abnormal   Collection Time: 07/24/15  8:45 AM  Result Value Ref Range   Squamous Epithelial / LPF NONE SEEN NONE SEEN   WBC, UA TOO NUMEROUS TO COUNT 0 - 5 WBC/hpf   RBC / HPF 0-5 0 - 5 RBC/hpf   Bacteria, UA MANY (A) NONE SEEN   Crystals TRIPLE PHOSPHATE CRYSTALS (A) NEGATIVE  CBC     Status: Abnormal   Collection Time: 07/25/15  4:47 AM  Result Value Ref Range   WBC 13.8 (H) 4.0 - 10.5 K/uL   RBC 4.09 (L) 4.22 - 5.81 MIL/uL   Hemoglobin 12.3 (L) 13.0 - 17.0 g/dL   HCT 39.0 39.0 - 52.0 %   MCV 95.4 78.0 - 100.0 fL   MCH 30.1 26.0 - 34.0 pg   MCHC 31.5 30.0 - 36.0 g/dL   RDW 13.7 11.5 - 15.5 %   Platelets 166 150 - 400 K/uL  Basic metabolic panel     Status: Abnormal   Collection Time: 07/25/15  4:47 AM  Result Value Ref Range   Sodium 141 135 - 145 mmol/L   Potassium 5.0 3.5 - 5.1 mmol/L   Chloride 110 101 - 111 mmol/L   CO2 23 22 - 32 mmol/L   Glucose, Bld 124 (H) 65 - 99 mg/dL   BUN 30 (H) 6 - 20 mg/dL   Creatinine, Ser 2.14 (H) 0.61 - 1.24 mg/dL   Calcium 8.8 (L) 8.9 - 10.3 mg/dL   GFR calc non Af Amer 25 (L) >60 mL/min   GFR calc Af Amer 29 (L) >60 mL/min    Comment: (NOTE) The eGFR has been calculated using the CKD EPI equation. This calculation has not been validated in all clinical situations. eGFR's persistently <60 mL/min signify possible Chronic Kidney Disease.    Anion gap 8 5 - 15  PSA     Status: Abnormal   Collection Time: 07/25/15  8:41 AM  Result Value Ref Range   PSA 129.23 (H) 0.00 - 4.00 ng/mL    Comment: (NOTE) While PSA levels of <=4.0 ng/ml are reported as reference range, some men with levels below 4.0 ng/ml can have prostate cancer and  many men with PSA above 4.0 ng/ml do not have prostate cancer.  Other  tests such as free PSA, age specific reference ranges, PSA velocity and PSA doubling time may be helpful especially in men less than 82 years old.     Dg Thoracic Spine 2 View  07/24/2015  CLINICAL DATA:  Fall. EXAM: THORACIC SPINE 2 VIEWS COMPARISON:  10/29/2011. FINDINGS: Prior CABG. Mild scoliosis concave left. Diffuse osteopenia degenerative change. Changes of ankylosing spondylitis noted. No acute abnormality . IMPRESSION: 1. Diffuse osteopenia and degenerative change. Changes of ankylosing spondylitis noted. 2. No acute abnormality . Electronically Signed   By: Marcello Moores  Register   On: 07/24/2015 09:15   Dg Lumbar Spine Complete  07/24/2015  CLINICAL DATA:  Fall.  Initial evaluation. EXAM: LUMBAR SPINE - COMPLETE 4+ VIEW COMPARISON:  MRI 03/07/2010. FINDINGS: Diffuse multilevel degenerative change and changes of ankylosing spondylitis noted. No acute bony abnormality identified. No evidence of fracture. Aortoiliac atherosclerotic vascular calcification. IMPRESSION: 1. Diffuse multilevel degenerative change. Changes of ankylosing spondylitis. No acute bony abnormality identified. 2. Aortoiliac atherosclerotic vascular calcification . Electronically Signed   By: Marcello Moores  Register   On: 07/24/2015 09:17   Mr Thoracic Spine Wo Contrast  07/24/2015  CLINICAL DATA:  Tripped and fell yesterday, severe back pain. Chronic back pain requiring epidural steroid injections. History of lumbar spine nerve sheath tumor. EXAM: MRI THORACIC AND LUMBAR SPINE WITHOUT CONTRAST TECHNIQUE: Multiplanar and multiecho pulse sequences of the thoracic and lumbar spine were obtained without intravenous contrast. COMPARISON:  Lumbar spine radiographs July 24, 2015 at 0843 hours and MRI lumbar spine March 07, 2010 FINDINGS: MRI THORACIC SPINE FINDINGS ALIGNMENT: Maintenance of the thoracic kyphosis. No malalignment. VERTEBRAE/DISCS: Diffuse low T1, bright T2 metastasis throughout the majority of the thoracic vertebral bodies,  expanding the ventral cortex at T6, T7, and extending to the LEFT costovertebral junction. 5.6 x 3.4 x 3.1 cm mass expands the LEFT seventh rib. No pathologic fracture. Maintenance of thoracic kyphosis. Intervertebral disc heights preserved. Approximate RIGHT seventh rib metastasis present. CORD: Thoracic spinal cord is normal morphology and signal characteristics to the level the conus medullaris which terminates at T12-L1. Prominent dorsal epidural fat mildly effaces the thecal sac. PREVERTEBRAL AND PARASPINAL SOFT TISSUES: Tumor extends into the prevertebral space at C6-7. Mild symmetric paraspinal muscle atrophy. DEGENERATIVE CHANGE: No significant disc bulge, canal stenosis or neural foraminal narrowing at any level though, axial sequences are motion degraded. MRI LUMBAR SPINE FINDINGS OSSEOUS STRUCTURES: Load T1 and bright T2 signal replacing the normal bone marrow signal of L1. No additional lumbar metastasis identified. Lumbar vertebral bodies intact and aligned with maintenance of lumbar lordosis. Severe L5-S1 disc height loss, and marginal spurring compatible with degenerative discs. Remaining disc heights maintained with decreased T2 signal compatible with mild disc desiccation. Moderate chronic discogenic endplate change at L5. No pathologic fracture or acute osseous process.a included view of the pelvis demonstrates RIGHT iliac metastasis. SPINAL CORD: Conus medullaris terminates at L1 and appears normal morphology and signal characteristics. 12 x 8 x 20 mm intradural low T1, bright T2 mass at L1-2 is slightly larger than prior examination though, not tailored for evaluation by noncontrast examination. Mass displaces the cauda equina. SOFT TISSUES: Mild symmetric paraspinal muscle atrophy. LEFT paraspinal muscle edema. LEVEL BY LEVEL EVALUATION: E2-8: Intra canalicular tumor results since severe thecal sac effacement, AP dimension of the thecal sac is 3 mm. Mild facet arthropathy and ligamentum flavum  redundancy without osseous canal stenosis. No neural foraminal narrowing. L2-3 and  L3-4: Annular bulging, moderate facet arthropathy and ligamentum flavum redundancy without canal stenosis or neural foraminal narrowing. L4-5: Annular bulging. Severe facet arthropathy and ligamentum flavum redundancy result in severe canal stenosis. Mild bilateral neural foraminal narrowing. L5-S1: Broad-based RIGHT central disc protrusion on background of broad-based disc bulge results in mild canal stenosis. Effaced RIGHT lateral recess posteriorly displaces the traversing RIGHT S1 nerve. Mild facet arthropathy and ligamentum flavum redundancy. Mild RIGHT, moderate to severe LEFT neural foraminal narrowing. IMPRESSION: MR THORACIC SPINE IMPRESSION Diffuse osseous metastasis including bilateral ribs metastasis without pathologic fracture. No acute osseous process or malalignment. No significant neurocompressive changes. MR LUMBAR SPINE IMPRESSION L1 metastasis. No pathologic fracture nor acute osseous process. No malalignment. 12 x 8 x 20 mm intradural mass at L1-2 appears slightly larger, corresponding to known tumor. This would be better characterized on postcontrast imaging. Tumor results in severe thecal sac effacement at L1-2. Mild osseous canal stenosis L5-S1. Moderate to severe LEFT L5-S1 neural foraminal narrowing. Electronically Signed   By: Elon Alas M.D.   On: 07/24/2015 22:18   Mr Lumbar Spine Wo Contrast  07/24/2015  CLINICAL DATA:  Tripped and fell yesterday, severe back pain. Chronic back pain requiring epidural steroid injections. History of lumbar spine nerve sheath tumor. EXAM: MRI THORACIC AND LUMBAR SPINE WITHOUT CONTRAST TECHNIQUE: Multiplanar and multiecho pulse sequences of the thoracic and lumbar spine were obtained without intravenous contrast. COMPARISON:  Lumbar spine radiographs July 24, 2015 at 0843 hours and MRI lumbar spine March 07, 2010 FINDINGS: MRI THORACIC SPINE FINDINGS ALIGNMENT:  Maintenance of the thoracic kyphosis. No malalignment. VERTEBRAE/DISCS: Diffuse low T1, bright T2 metastasis throughout the majority of the thoracic vertebral bodies, expanding the ventral cortex at T6, T7, and extending to the LEFT costovertebral junction. 5.6 x 3.4 x 3.1 cm mass expands the LEFT seventh rib. No pathologic fracture. Maintenance of thoracic kyphosis. Intervertebral disc heights preserved. Approximate RIGHT seventh rib metastasis present. CORD: Thoracic spinal cord is normal morphology and signal characteristics to the level the conus medullaris which terminates at T12-L1. Prominent dorsal epidural fat mildly effaces the thecal sac. PREVERTEBRAL AND PARASPINAL SOFT TISSUES: Tumor extends into the prevertebral space at C6-7. Mild symmetric paraspinal muscle atrophy. DEGENERATIVE CHANGE: No significant disc bulge, canal stenosis or neural foraminal narrowing at any level though, axial sequences are motion degraded. MRI LUMBAR SPINE FINDINGS OSSEOUS STRUCTURES: Load T1 and bright T2 signal replacing the normal bone marrow signal of L1. No additional lumbar metastasis identified. Lumbar vertebral bodies intact and aligned with maintenance of lumbar lordosis. Severe L5-S1 disc height loss, and marginal spurring compatible with degenerative discs. Remaining disc heights maintained with decreased T2 signal compatible with mild disc desiccation. Moderate chronic discogenic endplate change at L5. No pathologic fracture or acute osseous process.a included view of the pelvis demonstrates RIGHT iliac metastasis. SPINAL CORD: Conus medullaris terminates at L1 and appears normal morphology and signal characteristics. 12 x 8 x 20 mm intradural low T1, bright T2 mass at L1-2 is slightly larger than prior examination though, not tailored for evaluation by noncontrast examination. Mass displaces the cauda equina. SOFT TISSUES: Mild symmetric paraspinal muscle atrophy. LEFT paraspinal muscle edema. LEVEL BY LEVEL  EVALUATION: E5-6: Intra canalicular tumor results since severe thecal sac effacement, AP dimension of the thecal sac is 3 mm. Mild facet arthropathy and ligamentum flavum redundancy without osseous canal stenosis. No neural foraminal narrowing. L2-3 and L3-4: Annular bulging, moderate facet arthropathy and ligamentum flavum redundancy without canal stenosis or neural foraminal narrowing. L4-5:  Annular bulging. Severe facet arthropathy and ligamentum flavum redundancy result in severe canal stenosis. Mild bilateral neural foraminal narrowing. L5-S1: Broad-based RIGHT central disc protrusion on background of broad-based disc bulge results in mild canal stenosis. Effaced RIGHT lateral recess posteriorly displaces the traversing RIGHT S1 nerve. Mild facet arthropathy and ligamentum flavum redundancy. Mild RIGHT, moderate to severe LEFT neural foraminal narrowing. IMPRESSION: MR THORACIC SPINE IMPRESSION Diffuse osseous metastasis including bilateral ribs metastasis without pathologic fracture. No acute osseous process or malalignment. No significant neurocompressive changes. MR LUMBAR SPINE IMPRESSION L1 metastasis. No pathologic fracture nor acute osseous process. No malalignment. 12 x 8 x 20 mm intradural mass at L1-2 appears slightly larger, corresponding to known tumor. This would be better characterized on postcontrast imaging. Tumor results in severe thecal sac effacement at L1-2. Mild osseous canal stenosis L5-S1. Moderate to severe LEFT L5-S1 neural foraminal narrowing. Electronically Signed   By: Elon Alas M.D.   On: 07/24/2015 22:18    Pertinent items are noted in HPI. Blood pressure 114/92, pulse 98, temperature 97.8 F (36.6 C), temperature source Axillary, resp. rate 16, SpO2 96 %. The patient is very somnolent. He will awaken but complains of severe back pain. He will intermittently follow commands. He moves both lower extremities spontaneously with reasonably normal appearing motor strength  bilaterally. Reflexes are hypoactive but symmetric.  Assessment/Plan: The patient has a number of ongoing spinal issues. I believe the patient has some degree of an acute left-sided pathologic/minimal compression fracture of L1. I believe this is responsible for most of his acute pain and this pain is likely to improve over the next few days and/or weeks. I do not recommend any urgent surgical interventions for this pain. As patient's pain is better controlled he may be mobilized with a lumbar corset.  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 Although this does cause significant compression of his conus medullaris and upper cauda equina I cannot recommend surgical excision given his functional status, age, and previous decision not to pursue surgery.  The patient has newly discovered extensive metastatic disease throughout his thoracic and upper lumbar spine. Given that he did not have pain attributable to this prior to the fall I think is unlikely that his cancer is significantly responsible for his pain and therefore I would not recommend acute radiation treatment. Decision regarding biopsy of paravertebral mass is dependent on the patient and family's decision with regard to limits and goals of ongoing care. I think it is reasonable to get palliative care and/or hospice involvement.  With regard to management of his ongoing pain I think it is reasonable to start high-dose intravenous Decadron in addition to narcotic pain medications.  Nalina Yeatman A 07/25/2015, 3:08 PM

## 2015-07-25 NOTE — Progress Notes (Signed)
Family Medicine Teaching Service Daily Progress Note Intern Pager: [phone]  Patient name: James Small Medical record number: [id number] Date of birth: May 26, 1922 Age: 82 y.o. Gender: male  Primary Care Provider: Limmie Patricia, MD Consultants: None Code Status: Full (confirmed upon admission)  Pt Overview and Major Events to Date:  6/12: Patient presents with worsening pain after fall  Assessment and Plan: James Small is a 82 y.o. male presenting with back pain x 1 day after fall. 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  # Back pain: Worsened since fall 6/11. Imaging showing extensive spinal metastases of unknown origin and no fractures. Xray lumbar and thoracic spine showed changes consistent with ankylosing spondylitis (Denies previous history). No fractures noted. Diffuse osteopenia and degenerative changes of thoracic spine. Given history of intrathecal mass at L2 and location of pain, will evaluate further with MRI. No radiculopathy on exam. No loss of bowel function. No saddle anesthesia. MRI thoracic/lumbar spine shows metastases throughout thoracic verterbral bodies into left costovertebral junction with mass of L 7th rib and matastasis to R 7th rib, as well as tumor into prevertebral space at C6-C7, R iliac metastasis, and mass at L1-L2 slightly larger (12x8x20 mm vs 11x14x16 mm in Jan 2012) with effacement of thecal sac.  - Will need goals of care discussion to decide extent of further workup - Consulted Palliative Care for assistance with pain management and further goals of care discussion - Consulted Neurosurgery for advice on stabilization/possible treatment options to offer family - Obtain PSA - Ordered lidocaine patch for non-sedating, local pain control - Fentanyl 12.5 mcg q2h prn  for pain, dose decreased from initial 76m due to somnolence.  - Baclofen switched from 5 mg TID scheduled to prn - Will avoid NSAID use at this time due to renal function. - Baclofen 5 mg TID prn for muscle spasm - Hold on PT/OT until pain improved  # AKI in setting of CKD: Improving. 2.14 < 2.26. Baseline SCr ~ 2.0 (according to labs with SCr 2.07 from 06/26/15). Daughter says renal function has been stable with indwelling suprapubic cath. May be due to dehydration from decreased PO 2/2 pain, but BUN:Cr ratio < 20 and patient did have breakfast this morning.  - Avoid nephrotoxic medications, including NSAIDs - Start D5NS_0 /hr while pain limits adequate PO intake. Discontinue once pain is better controlled and patient able to sit up and eat/drink. - AM BMPs  # Leukocytosis: Stable. WBC 13.8 < 15.4 on admission, previously elevated to 10.9 with last labs 06/26/15. UA with leukocytosis and micro with bacteria in setting of indwelling cath. Suspect chronic colonization. McGeer's Criteria not met, with patient afebrile without rigors or hypotension, no changes in mental status, and no purulent discharge.  - Continue to monitor daily CBCs.  - Would not get urine culture or consider treatment of UTI unless patient develops fever or has worsening leukocytosis - Antibiotics not indicated, consider if clinically changes  # BPH and neurogenic bladder: Not medically managed. Has indwelling catheter.  - Flush catheter three times weekly - Change suprapubic catheter if not discharged by 6/15  # CAD s/p CABG in 1996: Follows with Dr. HPercival Spanish Seen 07/18/15 and no medication changes suggested. Myoview from 04/2013 showed no high risk findings.  - Continue home aspirin 81 mg BID - Continue home atorvastatin 10 mg QHS - Consider cardiac imaging  given increased risk of cardiovascular disease with Ankylosing spondylitis.  # Hypothyroidism: - Continue home synthroid 150 mcg with breakfast  # Poor  appetite: - Continue home megace 3 mL BID when eating  FEN/GI: NPO while sedated, attempt meds with sips, NS @ 125 mL/hr, oral care  Prophylaxis: Lovenox (Renally Dosed)  Disposition: Admitted for pain management and further work-up.   Subjective:  Patient too sedated to describe his pain this morning. Intermittently moaning in pain. Spoke with son this morning and relayed results of imaging.   Objective: Temp:  [98.3 F (36.8 C)] 98.3 F (36.8 C) (06/12 0825) Pulse Rate:  [81-95] 81 (06/12 1345) Resp:  [16-17] 16 (06/12 1345) BP: (115-152)/(64-89) 115/64 mmHg (06/12 1345) SpO2:  [94 %-100 %] 94 % (06/12 1345) Physical Exam: General: Elderly male, lying in bed with eyes closed, intermittently moaning Cardiovascular: RRR, S1, S2, no m/r/g Respiratory: CTAB, no increased WOB Abdomen: +BS, S, ND, suprapubic catheter in place MSK: Normal tone. Could not follow commands to test strength and ROM today. Pain with lateral movement in bed.  Skin: 3 cm healing laceration over right elbow Neuro: Easily arousable but quickly closes eyes again (yesterday was AOx3) Psych: Appropriate mood and affect  Laboratory:  Recent Labs Lab 07/24/15 0842 07/25/15 0447  WBC 15.4* 13.8*  HGB 13.5 12.3*  HCT 41.8 39.0  PLT 202 166    Recent Labs Lab 07/24/15 0842 07/25/15 0447  NA 140 141  K 4.8 5.0  CL 107 110  CO2 22 23  BUN 37* 30*  CREATININE 2.26* 2.14*  CALCIUM 9.4 8.8*  GLUCOSE 109* 124*   Imaging/Diagnostic Tests: Dg Thoracic Spine 2 View  07/24/2015  CLINICAL DATA:  Fall. EXAM: THORACIC SPINE 2 VIEWS COMPARISON:  10/29/2011. FINDINGS: Prior CABG. Mild scoliosis concave left. Diffuse osteopenia degenerative change. Changes of ankylosing spondylitis noted. No acute abnormality . IMPRESSION: 1. Diffuse osteopenia and degenerative change. Changes of ankylosing spondylitis noted. 2. No acute abnormality . Electronically Signed   By: Marcello Moores  Register   On: 07/24/2015 09:15   Dg  Lumbar Spine Complete  07/24/2015  CLINICAL DATA:  Fall.  Initial evaluation. EXAM: LUMBAR SPINE - COMPLETE 4+ VIEW COMPARISON:  MRI 03/07/2010. FINDINGS: Diffuse multilevel degenerative change and changes of ankylosing spondylitis noted. No acute bony abnormality identified. No evidence of fracture. Aortoiliac atherosclerotic vascular calcification. IMPRESSION: 1. Diffuse multilevel degenerative change. Changes of ankylosing spondylitis. No acute bony abnormality identified. 2. Aortoiliac atherosclerotic vascular calcification . Electronically Signed   By: Marcello Moores  Register   On: 07/24/2015 09:17   Mr Thoracic Spine Wo Contrast  07/24/2015  CLINICAL DATA:  Tripped and fell yesterday, severe back pain. Chronic back pain requiring epidural steroid injections. History of lumbar spine nerve sheath tumor. EXAM: MRI THORACIC AND LUMBAR SPINE WITHOUT CONTRAST TECHNIQUE: Multiplanar and multiecho pulse sequences of the thoracic and lumbar spine were obtained without intravenous contrast. COMPARISON:  Lumbar spine radiographs July 24, 2015 at 0843 hours and MRI lumbar spine March 07, 2010 FINDINGS: MRI THORACIC SPINE FINDINGS ALIGNMENT: Maintenance of the thoracic kyphosis. No malalignment. VERTEBRAE/DISCS: Diffuse low T1, bright T2 metastasis throughout the majority of the thoracic vertebral bodies, expanding the ventral cortex at T6, T7, and extending to the LEFT costovertebral junction. 5.6 x 3.4 x 3.1 cm mass expands the LEFT seventh rib. No pathologic fracture. Maintenance of thoracic kyphosis. Intervertebral disc heights preserved. Approximate RIGHT seventh rib metastasis present. CORD: Thoracic spinal cord is normal morphology and signal characteristics to the level the conus  medullaris which terminates at T12-L1. Prominent dorsal epidural fat mildly effaces the thecal sac. PREVERTEBRAL AND PARASPINAL SOFT TISSUES: Tumor extends into the prevertebral space at C6-7. Mild symmetric paraspinal muscle atrophy.  DEGENERATIVE CHANGE: No significant disc bulge, canal stenosis or neural foraminal narrowing at any level though, axial sequences are motion degraded. MRI LUMBAR SPINE FINDINGS OSSEOUS STRUCTURES: Load T1 and bright T2 signal replacing the normal bone marrow signal of L1. No additional lumbar metastasis identified. Lumbar vertebral bodies intact and aligned with maintenance of lumbar lordosis. Severe L5-S1 disc height loss, and marginal spurring compatible with degenerative discs. Remaining disc heights maintained with decreased T2 signal compatible with mild disc desiccation. Moderate chronic discogenic endplate change at L5. No pathologic fracture or acute osseous process.a included view of the pelvis demonstrates RIGHT iliac metastasis. SPINAL CORD: Conus medullaris terminates at L1 and appears normal morphology and signal characteristics. 12 x 8 x 20 mm intradural low T1, bright T2 mass at L1-2 is slightly larger than prior examination though, not tailored for evaluation by noncontrast examination. Mass displaces the cauda equina. SOFT TISSUES: Mild symmetric paraspinal muscle atrophy. LEFT paraspinal muscle edema. LEVEL BY LEVEL EVALUATION: B7-6: Intra canalicular tumor results since severe thecal sac effacement, AP dimension of the thecal sac is 3 mm. Mild facet arthropathy and ligamentum flavum redundancy without osseous canal stenosis. No neural foraminal narrowing. L2-3 and L3-4: Annular bulging, moderate facet arthropathy and ligamentum flavum redundancy without canal stenosis or neural foraminal narrowing. L4-5: Annular bulging. Severe facet arthropathy and ligamentum flavum redundancy result in severe canal stenosis. Mild bilateral neural foraminal narrowing. L5-S1: Broad-based RIGHT central disc protrusion on background of broad-based disc bulge results in mild canal stenosis. Effaced RIGHT lateral recess posteriorly displaces the traversing RIGHT S1 nerve. Mild facet arthropathy and ligamentum flavum  redundancy. Mild RIGHT, moderate to severe LEFT neural foraminal narrowing. IMPRESSION: MR THORACIC SPINE IMPRESSION Diffuse osseous metastasis including bilateral ribs metastasis without pathologic fracture. No acute osseous process or malalignment. No significant neurocompressive changes. MR LUMBAR SPINE IMPRESSION L1 metastasis. No pathologic fracture nor acute osseous process. No malalignment. 12 x 8 x 20 mm intradural mass at L1-2 appears slightly larger, corresponding to known tumor. This would be better characterized on postcontrast imaging. Tumor results in severe thecal sac effacement at L1-2. Mild osseous canal stenosis L5-S1. Moderate to severe LEFT L5-S1 neural foraminal narrowing. Electronically Signed   By: Elon Alas M.D.   On: 07/24/2015 22:18   Mr Lumbar Spine Wo Contrast  07/24/2015  CLINICAL DATA:  Tripped and fell yesterday, severe back pain. Chronic back pain requiring epidural steroid injections. History of lumbar spine nerve sheath tumor. EXAM: MRI THORACIC AND LUMBAR SPINE WITHOUT CONTRAST TECHNIQUE: Multiplanar and multiecho pulse sequences of the thoracic and lumbar spine were obtained without intravenous contrast. COMPARISON:  Lumbar spine radiographs July 24, 2015 at 0843 hours and MRI lumbar spine March 07, 2010 FINDINGS: MRI THORACIC SPINE FINDINGS ALIGNMENT: Maintenance of the thoracic kyphosis. No malalignment. VERTEBRAE/DISCS: Diffuse low T1, bright T2 metastasis throughout the majority of the thoracic vertebral bodies, expanding the ventral cortex at T6, T7, and extending to the LEFT costovertebral junction. 5.6 x 3.4 x 3.1 cm mass expands the LEFT seventh rib. No pathologic fracture. Maintenance of thoracic kyphosis. Intervertebral disc heights preserved. Approximate RIGHT seventh rib metastasis present. CORD: Thoracic spinal cord is normal morphology and signal characteristics to the level the conus medullaris which terminates at T12-L1. Prominent dorsal epidural fat  mildly effaces the thecal sac. PREVERTEBRAL AND PARASPINAL  SOFT TISSUES: Tumor extends into the prevertebral space at C6-7. Mild symmetric paraspinal muscle atrophy. DEGENERATIVE CHANGE: No significant disc bulge, canal stenosis or neural foraminal narrowing at any level though, axial sequences are motion degraded. MRI LUMBAR SPINE FINDINGS OSSEOUS STRUCTURES: Load T1 and bright T2 signal replacing the normal bone marrow signal of L1. No additional lumbar metastasis identified. Lumbar vertebral bodies intact and aligned with maintenance of lumbar lordosis. Severe L5-S1 disc height loss, and marginal spurring compatible with degenerative discs. Remaining disc heights maintained with decreased T2 signal compatible with mild disc desiccation. Moderate chronic discogenic endplate change at L5. No pathologic fracture or acute osseous process.a included view of the pelvis demonstrates RIGHT iliac metastasis. SPINAL CORD: Conus medullaris terminates at L1 and appears normal morphology and signal characteristics. 12 x 8 x 20 mm intradural low T1, bright T2 mass at L1-2 is slightly larger than prior examination though, not tailored for evaluation by noncontrast examination. Mass displaces the cauda equina. SOFT TISSUES: Mild symmetric paraspinal muscle atrophy. LEFT paraspinal muscle edema. LEVEL BY LEVEL EVALUATION: R9-1: Intra canalicular tumor results since severe thecal sac effacement, AP dimension of the thecal sac is 3 mm. Mild facet arthropathy and ligamentum flavum redundancy without osseous canal stenosis. No neural foraminal narrowing. L2-3 and L3-4: Annular bulging, moderate facet arthropathy and ligamentum flavum redundancy without canal stenosis or neural foraminal narrowing. L4-5: Annular bulging. Severe facet arthropathy and ligamentum flavum redundancy result in severe canal stenosis. Mild bilateral neural foraminal narrowing. L5-S1: Broad-based RIGHT central disc protrusion on background of broad-based disc  bulge results in mild canal stenosis. Effaced RIGHT lateral recess posteriorly displaces the traversing RIGHT S1 nerve. Mild facet arthropathy and ligamentum flavum redundancy. Mild RIGHT, moderate to severe LEFT neural foraminal narrowing. IMPRESSION: MR THORACIC SPINE IMPRESSION Diffuse osseous metastasis including bilateral ribs metastasis without pathologic fracture. No acute osseous process or malalignment. No significant neurocompressive changes. MR LUMBAR SPINE IMPRESSION L1 metastasis. No pathologic fracture nor acute osseous process. No malalignment. 12 x 8 x 20 mm intradural mass at L1-2 appears slightly larger, corresponding to known tumor. This would be better characterized on postcontrast imaging. Tumor results in severe thecal sac effacement at L1-2. Mild osseous canal stenosis L5-S1. Moderate to severe LEFT L5-S1 neural foraminal narrowing. Electronically Signed   By: Elon Alas M.D.   On: 07/24/2015 22:18   Rogue Bussing, MD 07/25/2015, 7:25 AM PGY-1, [place] Intern pager: [phone], text pages welcome

## 2015-07-25 NOTE — Care Management Important Message (Signed)
Important Message  Patient Details  Name: James Small MRN: [id number] Date of Birth: Jan 05, 1923   Medicare Important Message Given:  Yes    Loann Quill 07/25/2015, 8:56 AM

## 2015-07-25 NOTE — Progress Notes (Signed)
PT Cancellation Note  Patient Details Name: James Small MRN: [id number] DOB: January 23, 1923   Cancelled Treatment:    Reason Eval/Treat Not Completed: Patient at procedure or test/unavailable;Medical issues which prohibited therapy.  Pt has MRI this AM and will await opinion as pt has intrathecal mass at L2 with recent fall, elderly with mets noted on ribs and osteopenia.  Will check back as pt's information is available.   Ramond Dial 07/25/2015, 7:51 AM    Mee Hives, PT MS Acute Rehab Dept. Number: [place] [facility] [place]

## 2015-07-25 NOTE — Progress Notes (Addendum)
Palliative:  Full note to follow. Conversation today with both daughter and son at bedside. They say they do not want him to suffer and want him comfortable. They also say they do not desire work up of metastatic disease or any surgical procedures. However, they do tell me that they want him a full code and we did discuss the consequences of this intervention. Family was also very concerned about transition from the hospital so we discussed all options from home health, SNF, and hospice options. Daughter was not at all on open to hospice at this time. We agree with continued trials of symptom management - definitely agree with addition of decadron and I added Lyrica if he is able to take po. I also added low dose haldol that may help symptoms and delirium that may be a result from pain itself. Will continue to follow closely and help support family and symptoms the best we can. Thank you for this consult.   James Sill, NP Palliative Medicine Team Pager # [phone] (M-F 8a-5p) Team Phone # [phone] (Nights/Weekends)

## 2015-07-26 DIAGNOSIS — Z515 Encounter for palliative care: Secondary | ICD-10-CM | POA: Insufficient documentation

## 2015-07-26 DIAGNOSIS — S3992XD Unspecified injury of lower back, subsequent encounter: Secondary | ICD-10-CM

## 2015-07-26 DIAGNOSIS — R410 Disorientation, unspecified: Secondary | ICD-10-CM

## 2015-07-26 DIAGNOSIS — Z7189 Other specified counseling: Secondary | ICD-10-CM | POA: Insufficient documentation

## 2015-07-26 LAB — BASIC METABOLIC PANEL
Anion gap: 12 (ref 5–15)
BUN: 26 mg/dL — AB (ref 6–20)
CALCIUM: 8.3 mg/dL — AB (ref 8.9–10.3)
CHLORIDE: 113 mmol/L — AB (ref 101–111)
CO2: 15 mmol/L — AB (ref 22–32)
CREATININE: 2.19 mg/dL — AB (ref 0.61–1.24)
GFR calc Af Amer: 29 mL/min — ABNORMAL LOW (ref >60)
GFR calc non Af Amer: 25 mL/min — ABNORMAL LOW (ref >60)
GLUCOSE: 240 mg/dL — AB (ref 65–99)
Potassium: 3.8 mmol/L (ref 3.5–5.1)
Sodium: 140 mmol/L (ref 135–145)

## 2015-07-26 LAB — CBC
HEMATOCRIT: 35 % — AB (ref 39.0–52.0)
HEMOGLOBIN: 11.2 g/dL — AB (ref 13.0–17.0)
MCH: 29.7 pg (ref 26.0–34.0)
MCHC: 32 g/dL (ref 30.0–36.0)
MCV: 92.8 fL (ref 78.0–100.0)
Platelets: 170 10*3/uL (ref 150–400)
RBC: 3.77 MIL/uL — ABNORMAL LOW (ref 4.22–5.81)
RDW: 13.4 % (ref 11.5–15.5)
WBC: 13.2 10*3/uL — ABNORMAL HIGH (ref 4.0–10.5)

## 2015-07-26 MED ORDER — HALOPERIDOL LACTATE 5 MG/ML IJ SOLN: 2.0000 mg | Freq: Four times a day (QID) | INTRAMUSCULAR | Status: DC | PRN

## 2015-07-26 MED ORDER — HALOPERIDOL LACTATE 5 MG/ML IJ SOLN
2.0000 mg | Freq: Four times a day (QID) | INTRAMUSCULAR | Status: DC | PRN
  Administered 2015-07-27 – 2015-07-30 (×10): 2 mg via INTRAVENOUS
  Filled 2015-07-26 (×11): qty 1

## 2015-07-26 MED ORDER — LORAZEPAM 2 MG/ML IJ SOLN
1.0000 mg | Freq: Once | INTRAMUSCULAR | Status: AC
  Administered 2015-07-26: 1 mg via INTRAVENOUS
  Filled 2015-07-26: qty 1

## 2015-07-26 MED ORDER — LEVOTHYROXINE SODIUM 100 MCG IV SOLR
75.0000 ug | Freq: Every day | INTRAVENOUS | Status: DC
  Administered 2015-07-26 – 2015-07-28 (×3): 75 ug via INTRAVENOUS
  Filled 2015-07-26 (×3): qty 5

## 2015-07-26 MED ORDER — HALOPERIDOL LACTATE 5 MG/ML IJ SOLN: 1.0000 mg | Freq: Three times a day (TID) | INTRAMUSCULAR | Status: DC

## 2015-07-26 MED ORDER — HALOPERIDOL LACTATE 2 MG/ML PO CONC
1.0000 mg | Freq: Two times a day (BID) | ORAL | Status: DC
  Administered 2015-07-26 – 2015-07-27 (×4): 1 mg via ORAL
  Filled 2015-07-26 (×6): qty 0.5

## 2015-07-26 NOTE — Evaluation (Signed)
Clinical/Bedside Swallow Evaluation Patient Details  Name: James Small MRN: [id number] Date of Birth: Oct 20, 1922  Today's Date: 07/26/2015 Time: SLP Start Time (ACUTE ONLY): 1506 SLP Stop Time (ACUTE ONLY): 1530 SLP Time Calculation (min) (ACUTE ONLY): 24 min  Past Medical History:  Past Medical History  Diagnosis Date  . Inguinal hernia     left  . Thyroid disease   . Dyslipidemia   . BPH (benign prostatic hyperplasia)   . Coronary artery disease     status post cabg in 1996 by [facility] with LIMA to the LAD, SVG to D1 and D2, SVG to circumflex, SVG to right coronary artery  . Hypertension   . Hypothyroidism   . Chronic kidney disease   . Presence of indwelling urinary catheter   . Weight loss     25 lbs over last few months per pt   . Arthritis     lower back - pt has epidural injections every few months   . Cancer [facility])     metasatic / spine    Past Surgical History:  Past Surgical History  Procedure Laterality Date  . Coronary artery bypass graft      1996  . Cyst on necksurgery      HPI:  82 year old male admitted following a mechanical fall at home. 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 Patient chronically limited by back pain with some lower extremity symptoms. Patient with chronic urinary incontinence status post placement of suprapubic catheter. No new obvious complaints of sensory loss or weakness. Workup also demonstrates metastatic disease extensively through multiple vertebral bodies and paravertebral in his thoracic spine and L1 vertebral body. Per neurosurgery, Pt with some degree of L L1 compression fx.    Assessment / Plan / Recommendation Clinical Impression  Pt referred for clinical assessment of swallowing given positional limitations and concern for swallow safety. Pt has been kept NPO and had frequent pain meds in an attempt to  manage pain. Pt tolerated slow HOB elevation to 31 degrees before crying out. Pt was able to demonstrate swallow initially with water pipetted via straw and then later by sucking through the straw. No overt s/s aspiration noted, however risk of aspiration is significant given AMS and difficulty coordinating respiration with swallow. At this point, recommend NPO. Oral swabs for comfort. Pt is not requesting PO, so pt's daughter verbalized feeling good about maintaining NPO status until pt more interactive. Anticipate pt will be able to swallow WNL once delirium has resolved. Will follow.    Aspiration Risk  Moderate aspiration risk    Diet Recommendation NPO   Medication Administration: Via alternative means    Other  Recommendations Oral Care Recommendations: Oral care BID   Follow up Recommendations   (TBD)    Frequency and Duration min 2x/week  2 weeks       Prognosis Prognosis for Safe Diet Advancement: Fair Barriers to Reach Goals: Medication (Delirium/pain)      Swallow Study   General Date of Onset: 07/24/15 HPI: 82 year old male admitted following a mechanical fall at home. 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 Patient chronically limited by back pain with some lower extremity symptoms. Patient with chronic urinary incontinence status post placement of suprapubic catheter. No new obvious complaints of sensory loss or weakness. Workup also demonstrates metastatic disease  extensively through multiple vertebral bodies and paravertebral in his thoracic spine and L1 vertebral body. Per neurosurgery, Pt with some degree of L L1 compression fx.  Type of Study: Bedside Swallow Evaluation Previous Swallow Assessment: none known Diet Prior to this Study: NPO Temperature Spikes Noted: No Respiratory Status: Room air History of Recent Intubation: No Behavior/Cognition:  Agitated Oral Cavity Assessment: Dry Oral Care Completed by SLP: Yes Oral Cavity - Dentition: Adequate natural dentition;Missing dentition Self-Feeding Abilities: Total assist Patient Positioning: Partially reclined (31 degrees) Baseline Vocal Quality: Normal Volitional Cough: Cognitively unable to elicit Volitional Swallow: Unable to elicit    Oral/Motor/Sensory Function Overall Oral Motor/Sensory Function:  (unable to assess- appears WFL)   Ice Chips Ice chips: Not tested   Thin Liquid Thin Liquid: Impaired Presentation: Straw (via pipette and sucking)    Nectar Thick Nectar Thick Liquid: Not tested   Honey Thick Honey Thick Liquid: Not tested   Puree Puree: Not tested   Solid   GO   Solid: Not tested        Vinetta Bergamo MA, [place] Pager [phone] 07/26/2015,3:41 PM

## 2015-07-26 NOTE — Progress Notes (Signed)
Daily Progress Note   Patient Name: James Small       Date: 07/26/2015 DOB: 30-Jul-1922  Age: 82 y.o. MRN#: [id number] Attending Physician: James Resides, MD Primary Care Physician: James Patricia, MD Admit Date: 07/24/2015  Reason for Consultation/Follow-up: Establishing goals of care and Pain control  Subjective: HPI: 82 y.o. male with past medical history of BPH, back pain, intradural mass, HTN, hypothyroidism, dyslipidemia, coronary atherosclerosis admitted on 07/24/2015 with back pain resulting from a fall at home. 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 Per neurosurgery's note there appears to be a compression fracture at L1 that is contributing to patient's significant pain. Met with patient's son, James Small; and daughter, James Small; at bedside. Prior to fall patient was independent home with all ADL's including financial matters. His wife of over 33 years died from dementia in 50 and James Small had been her caretaker. 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 He has been receiving steroid injections for a mass on his spine that was considered benign for the past five years. Daughter, James Small, notes he has lost significant amount of weight over the past 3 months but had improvement with Megace. Otherwise, he has had no complaints and has good functional status.  Interval history: Today he appears less agitated and pain is better controlled. Per chart he became agitated overnight and was given a onetime dose of lorazepam. He pulled out his IV and mittens were applied. This morning he had haldol and  fentanyl just before evaluation and he appears to be now resting without distress. He awoke during our visit and was moaning, did not respond to commands or questions. He was soothed with oral care and returned to a resting state. His daughter James Small, continues to request Full Code status, but seems more receptive to discussion about the possibility that patient may not return home after this visit. She reiterated that her first priority is to make her firather comfortable. Reviewed with her the difficulty of this case in that baseline function is difficult to establish while he remains altered. Currently, goal is to try and balance pain control while achieving improved mental status.  Length of Stay: 2  Current Medications: Scheduled Meds:  . aspirin EC  81 mg Oral BID  . atenolol  25 mg Oral Daily  . atorvastatin  10 mg Oral QHS  . dexamethasone  10 mg Intravenous Q6H  . enoxaparin (LOVENOX) injection  30 mg Subcutaneous Q24H  . levothyroxine  75 mcg Intravenous Daily  . lidocaine  1 patch Transdermal Q24H  . loratadine  10 mg Oral Daily  . megestrol  120 mg Oral BID  . pregabalin  25 mg Oral Daily    Continuous Infusions: . sodium chloride Stopped (07/24/15 1334)  . dextrose 5 % and 0.9% NaCl 125 mL/hr at 07/26/15 0536    PRN Meds: acetaminophen **OR** acetaminophen, baclofen, fentaNYL (SUBLIMAZE) injection, haloperidol lactate  Physical Exam  Constitutional: He appears well-developed.  Distressed at times when awake  HENT:  Head: Normocephalic and atraumatic.  Dry mucosal membranes  Cardiovascular: Normal rate and regular rhythm.   Pulmonary/Chest: Effort normal and breath sounds normal.  Abdominal: Soft. Bowel sounds are normal.  Genitourinary:  Catheter in place- urine clear, yellow  Neurological:  Disoriented, nonverbal, unable to follow commands  Skin:  Scattered bruising on arms  Psychiatric: Cognition and memory are impaired. He is noncommunicative.  Altered mental  status, agitated intermittently  Nursing note and vitals reviewed.           Vital Signs: BP 114/55 mmHg  Pulse 93  Temp(Src) 98.8 F (37.1 C) (Oral)  Resp 18  SpO2 100% SpO2: SpO2: 100 % O2 Device: O2 Device: Not Delivered O2 Flow Rate:    Intake/output summary:  Intake/Output Summary (Last 24 hours) at 07/26/15 1204 Last data filed at 07/26/15 0908  Gross per 24 hour  Intake 2020.83 ml  Output    975 ml  Net 1045.83 ml   LBM: Last BM Date: 07/23/15 Baseline Weight:   Most recent weight:         Palliative Assessment/Data:    Flowsheet Rows        Most Recent Value   Intake Tab    Referral Department  -- [teaching service]   Unit at Time of Referral  Med/Surg Unit   Palliative Care Primary Diagnosis  Cancer   Date Notified  07/25/15   Palliative Care Type  New Palliative care   Reason for referral  Pain, Clarify Goals of Care   Date of Admission  07/24/15   Date first seen by Palliative Care  07/25/15   # of days Palliative referral response time  0 Day(s)   # of days IP prior to Palliative referral  1   Clinical Assessment    Palliative Performance Scale Score  10%   Psychosocial & Spiritual Assessment    Social Work Plan of Care  Education on Hospice, Clarified patient/family wishes with healthcare team, Participated in Family meeting   Palliative Care Outcomes    Patient/Family meeting held?  Yes   Palliative Care Outcomes  Clarified goals of care, Counseled regarding hospice, Improved pain interventions   Patient/Family wishes: Interventions discontinued/not started   Other (Comment), Tube feedings/TPN [no long term ventilation ]   Palliative Care follow-up planned  Yes, Facility   Palliative Care Follow-up Reason  Pain, Clarify goals of care      Patient Active Problem List   Diagnosis Date Noted  . Acute delirium   . Palliative care encounter   . Goals of care, counseling/discussion   . Bone metastases ([place])   . Bone mass   .  Intradural mass  ([place])   . Back injury 07/24/2015  . Back pain 07/24/2015  . Back injuries   . Fall   . Renal insufficiency   . High risk social situation   . ESSENTIAL HYPERTENSION, BENIGN 12/06/2008  . HYPOTHYROIDISM 12/01/2008  . DYSLIPIDEMIA 12/01/2008  . 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 Of note- his PSA was significantly elevated therefore Prostate is a likely etiology, however, family does not wish for further workup. Continue  fentanyl 12.'5mg'$  q 2 hours PRN, IV Dexamethasone, and transdermal lidocaine patches.   Delirium: unknown etiology. Will add Haldol '1mg'$  oral solution BID.    Code Status: continue full code status, but will also continue to address in conversations with family as patient would likely not survive code and functional status after code would be greatly decreased.   Goals of Care and Additional Recommendations:  Limitations on Scope of Treatment: No Artificial Feeding  Code Status:    Code Status Orders        Start     Ordered   07/24/15 1426  Full code   Continuous     07/24/15 1425    Code Status History    Date Active Date Inactive Code Status Order ID Comments User Context   This patient has a current code status but no historical code status.    Advance Directive Documentation        Most Recent Value   Type of Advance Directive  Living will   Pre-existing out of facility DNR order (yellow form or pink MOST form)     "MOST" Form in Place?         Prognosis:   Unable to determine- dependent on patient's mental status progression and/if any changes in oral intake  Discharge Planning:  [place] with Hospice vs. Hospice facility pending hospice evaluation d/t cancer with no further workup  or treatment, and decreased functional status  Care plan was discussed with daughter, James Small. Will continue to follow.  Thank you for allowing the Palliative Medicine Team to assist in the care of this patient.    Time In: 1030 Time Out: 1120 Total Time 50 minutes Prolonged Time Billed  No       Greater than 50%  of this time was spent counseling and coordinating care related to the above assessment and plan.  Payton Emerald, NP  Vinie Sill, NP  Please contact Palliative Medicine Team phone at [phone] for questions and concerns.

## 2015-07-26 NOTE — Progress Notes (Signed)
Family Medicine Teaching Service Daily Progress Note Intern Pager: [phone]  Patient name: James Small Medical record number: [id number] Date of birth: 12-07-1922 Age: 82 y.o. Gender: male  Primary Care Provider: Limmie Patricia, MD Consultants: None Code Status: Full (confirmed upon admission)  Pt Overview and Major Events to Date:  6/12: Patient presents with worsening pain after fall  Assessment and Plan: James Small is a 82 y.o. male presenting with back pain x 1 day after fall. 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  # Back pain: Worsened since fall 6/11. Imaging showing extensive spinal metastases of unknown origin and no fractures. Xray lumbar and thoracic spine showed changes consistent with ankylosing spondylitis (Denies previous history). No fractures noted. Diffuse osteopenia and degenerative changes of thoracic spine. Given history of intrathecal mass at L2 and location of pain, will evaluate further with MRI. No radiculopathy on exam. No loss of bowel function. No saddle anesthesia. MRI thoracic/lumbar spine shows metastases throughout thoracic verterbral bodies into left costovertebral junction with mass of L 7th rib and matastasis to R 7th rib, as well as tumor into prevertebral space at C6-C7, R iliac metastasis, and mass at L1-L2 slightly larger (12x8x20 mm vs 11x14x16 mm in Jan 2012) with effacement of thecal sac.  - Will need goals of care discussion to decide extent of further workup - Consulted Palliative Care for assistance with pain management and further goals of care discussion --> Added Lyrica if patient is able to tolerate PO. Agreed with steroids. Confirmed with family they want patient to remain Full Code and that they are not interested in pursuing workup of metastatic  disease at this time.  - Consulted Neurosurgery for advice on stabilization/possible treatment options to offer family --> Suspect L1 compression fracture is causing most of patient's acute pain. Hope for improvement over days to weeks to come. Recommended IV decadron 10 mg q6h. May need lumbar corset if starts to mobilize. 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 Annette Stable does not recommend surgical excision given patient's age, functional status, and previously wishes not to pursue surgery - Obtain PSA --> Elevated at 129.23.  - Ordered lidocaine patch for non-sedating, local pain control - Fentanyl 12.5 mcg q2h prn for pain, dose decreased from initial '25mg'$  due to somnolence.  - Baclofen switched from 5 mg TID scheduled to prn - Will avoid NSAID use at this time due to renal function. - Baclofen 5 mg TID prn for muscle spasm - Hold on PT/OT until pain improved - Ordered Nutrition and SLP consults for recommendations, as patient cannot currently tolerate PO or sit upright - Will try increased dose of haldol prn for agitation  # AKI in setting of CKD: Stable. 2.19 < 2.14 < 2.26. Baseline SCr ~ 2.0 (according to labs with SCr 2.07 from 06/26/15). Daughter says renal function has been stable with indwelling suprapubic cath. May be due to dehydration from decreased PO 2/2 pain, but BUN:Cr ratio < 20 and patient did have breakfast this morning.  - Avoid nephrotoxic medications, including NSAIDs - Start D5NS'@125cc'$ /hr while pain limits adequate PO intake. Discontinue once pain is better controlled and patient able to sit up and eat/drink. - AM BMPs  # Leukocytosis: Stable. WBC 13.2 < 13.8 < 15.4 on admission, previously elevated to 10.9 with last labs 06/26/15. UA with  leukocytosis and micro with bacteria in setting of indwelling cath. Suspect chronic colonization. McGeer's Criteria not met, with patient afebrile without rigors or hypotension, no changes in mental status, and no  purulent discharge.  - Continue to monitor daily CBCs.  - Would not get urine culture or consider treatment of UTI unless patient develops fever or has worsening leukocytosis - Antibiotics not indicated, consider if clinically changes  # BPH and neurogenic bladder: Not medically managed. Has indwelling catheter.  - Flush catheter three times weekly - Change suprapubic catheter if not discharged by 6/15  # CAD s/p CABG in 1996: Follows with Dr. Antoine Poche. Seen 07/18/15 and no medication changes suggested. Myoview from 04/2013 showed no high risk findings.  - Continue home aspirin 81 mg BID - Continue home atorvastatin 10 mg QHS - Consider cardiac imaging given increased risk of cardiovascular disease with Ankylosing spondylitis.  # Hypothyroidism: - Continue home synthroid 150 mcg with breakfast --> Will speak to pharmacy about IV dosing while NPO  # Poor appetite: - Continue home megace 3 mL BID when eating  #Hemoglobin drop: 13.5 on admission > 11.2. May be dilutional.  - Continue to monitor  FEN/GI: NPO while sedated, attempt meds with sips, D5NS @ 125 mL/hr, oral care  Prophylaxis: Lovenox (Renally Dosed)  Disposition: Admitted for pain management and further work-up.   Subjective:  Patient agitated and trying to take off his mitts this morning. Overnight, he required ativan for agitation, as he did not respond to haldol.   Objective: Temp:  [97.8 F (36.6 C)-98.8 F (37.1 C)] 98.8 F (37.1 C) (06/14 0027) Pulse Rate:  [74-144] 98 (06/14 0038) Resp:  [16-30] 18 (06/14 0038) BP: (101-119)/(48-92) 115/87 mmHg (06/14 0027) SpO2:  [93 %-100 %] 100 % (06/14 0038) Physical Exam: General: Elderly male, lying in bed with eyes closed, intermittently moaning Cardiovascular: RRR, S1, S2, no m/r/g Respiratory: CTAB, no increased WOB Abdomen: +BS, S, ND, groaned with abdominal palpation, suprapubic catheter in place MSK: Normal tone. Moving all extremities spontaneously. Could not  follow commands to test strength and ROM today. Pain with lateral movement in bed.  Skin: 3 cm healing laceration over right elbow Neuro: Easily arousable but quickly closes eyes again (yesterday was AOx3). Did open eyes when asked.  Psych: Altered and confused.   Laboratory:  Recent Labs Lab 07/24/15 0842 07/25/15 0447 07/26/15 0337  WBC 15.4* 13.8* 13.2*  HGB 13.5 12.3* 11.2*  HCT 41.8 39.0 35.0*  PLT 202 166 170    Recent Labs Lab 07/24/15 0842 07/25/15 0447 07/26/15 0337  NA 140 141 140  K 4.8 5.0 3.8  CL 107 110 113*  CO2 22 23 15*  BUN 37* 30* 26*  CREATININE 2.26* 2.14* 2.19*  CALCIUM 9.4 8.8* 8.3*  GLUCOSE 109* 124* 240*   Imaging/Diagnostic Tests: Dg Thoracic Spine 2 View  07/24/2015  CLINICAL DATA:  Fall. EXAM: THORACIC SPINE 2 VIEWS COMPARISON:  10/29/2011. FINDINGS: Prior CABG. Mild scoliosis concave left. Diffuse osteopenia degenerative change. Changes of ankylosing spondylitis noted. No acute abnormality . IMPRESSION: 1. Diffuse osteopenia and degenerative change. Changes of ankylosing spondylitis noted. 2. No acute abnormality . Electronically Signed   By: Maisie Fus  Register   On: 07/24/2015 09:15   Dg Lumbar Spine Complete  07/24/2015  CLINICAL DATA:  Fall.  Initial evaluation. EXAM: LUMBAR SPINE - COMPLETE 4+ VIEW COMPARISON:  MRI 03/07/2010. FINDINGS: Diffuse multilevel degenerative change and changes of ankylosing spondylitis noted. No acute bony abnormality identified. No evidence of fracture.  Aortoiliac atherosclerotic vascular calcification. IMPRESSION: 1. Diffuse multilevel degenerative change. Changes of ankylosing spondylitis. No acute bony abnormality identified. 2. Aortoiliac atherosclerotic vascular calcification . Electronically Signed   By: Marcello Moores  Register   On: 07/24/2015 09:17   Mr Thoracic Spine Wo Contrast  07/24/2015  CLINICAL DATA:  Tripped and fell yesterday, severe back pain. Chronic back pain requiring epidural steroid injections. History  of lumbar spine nerve sheath tumor. EXAM: MRI THORACIC AND LUMBAR SPINE WITHOUT CONTRAST TECHNIQUE: Multiplanar and multiecho pulse sequences of the thoracic and lumbar spine were obtained without intravenous contrast. COMPARISON:  Lumbar spine radiographs July 24, 2015 at 0843 hours and MRI lumbar spine March 07, 2010 FINDINGS: MRI THORACIC SPINE FINDINGS ALIGNMENT: Maintenance of the thoracic kyphosis. No malalignment. VERTEBRAE/DISCS: Diffuse low T1, bright T2 metastasis throughout the majority of the thoracic vertebral bodies, expanding the ventral cortex at T6, T7, and extending to the LEFT costovertebral junction. 5.6 x 3.4 x 3.1 cm mass expands the LEFT seventh rib. No pathologic fracture. Maintenance of thoracic kyphosis. Intervertebral disc heights preserved. Approximate RIGHT seventh rib metastasis present. CORD: Thoracic spinal cord is normal morphology and signal characteristics to the level the conus medullaris which terminates at T12-L1. Prominent dorsal epidural fat mildly effaces the thecal sac. PREVERTEBRAL AND PARASPINAL SOFT TISSUES: Tumor extends into the prevertebral space at C6-7. Mild symmetric paraspinal muscle atrophy. DEGENERATIVE CHANGE: No significant disc bulge, canal stenosis or neural foraminal narrowing at any level though, axial sequences are motion degraded. MRI LUMBAR SPINE FINDINGS OSSEOUS STRUCTURES: Load T1 and bright T2 signal replacing the normal bone marrow signal of L1. No additional lumbar metastasis identified. Lumbar vertebral bodies intact and aligned with maintenance of lumbar lordosis. Severe L5-S1 disc height loss, and marginal spurring compatible with degenerative discs. Remaining disc heights maintained with decreased T2 signal compatible with mild disc desiccation. Moderate chronic discogenic endplate change at L5. No pathologic fracture or acute osseous process.a included view of the pelvis demonstrates RIGHT iliac metastasis. SPINAL CORD: Conus medullaris  terminates at L1 and appears normal morphology and signal characteristics. 12 x 8 x 20 mm intradural low T1, bright T2 mass at L1-2 is slightly larger than prior examination though, not tailored for evaluation by noncontrast examination. Mass displaces the cauda equina. SOFT TISSUES: Mild symmetric paraspinal muscle atrophy. LEFT paraspinal muscle edema. LEVEL BY LEVEL EVALUATION: M6-2: Intra canalicular tumor results since severe thecal sac effacement, AP dimension of the thecal sac is 3 mm. Mild facet arthropathy and ligamentum flavum redundancy without osseous canal stenosis. No neural foraminal narrowing. L2-3 and L3-4: Annular bulging, moderate facet arthropathy and ligamentum flavum redundancy without canal stenosis or neural foraminal narrowing. L4-5: Annular bulging. Severe facet arthropathy and ligamentum flavum redundancy result in severe canal stenosis. Mild bilateral neural foraminal narrowing. L5-S1: Broad-based RIGHT central disc protrusion on background of broad-based disc bulge results in mild canal stenosis. Effaced RIGHT lateral recess posteriorly displaces the traversing RIGHT S1 nerve. Mild facet arthropathy and ligamentum flavum redundancy. Mild RIGHT, moderate to severe LEFT neural foraminal narrowing. IMPRESSION: MR THORACIC SPINE IMPRESSION Diffuse osseous metastasis including bilateral ribs metastasis without pathologic fracture. No acute osseous process or malalignment. No significant neurocompressive changes. MR LUMBAR SPINE IMPRESSION L1 metastasis. No pathologic fracture nor acute osseous process. No malalignment. 12 x 8 x 20 mm intradural mass at L1-2 appears slightly larger, corresponding to known tumor. This would be better characterized on postcontrast imaging. Tumor results in severe thecal sac effacement at L1-2. Mild osseous canal stenosis L5-S1. Moderate to  severe LEFT L5-S1 neural foraminal narrowing. Electronically Signed   By: Elon Alas M.D.   On: 07/24/2015 22:18    Mr Lumbar Spine Wo Contrast  07/24/2015  CLINICAL DATA:  Tripped and fell yesterday, severe back pain. Chronic back pain requiring epidural steroid injections. History of lumbar spine nerve sheath tumor. EXAM: MRI THORACIC AND LUMBAR SPINE WITHOUT CONTRAST TECHNIQUE: Multiplanar and multiecho pulse sequences of the thoracic and lumbar spine were obtained without intravenous contrast. COMPARISON:  Lumbar spine radiographs July 24, 2015 at 0843 hours and MRI lumbar spine March 07, 2010 FINDINGS: MRI THORACIC SPINE FINDINGS ALIGNMENT: Maintenance of the thoracic kyphosis. No malalignment. VERTEBRAE/DISCS: Diffuse low T1, bright T2 metastasis throughout the majority of the thoracic vertebral bodies, expanding the ventral cortex at T6, T7, and extending to the LEFT costovertebral junction. 5.6 x 3.4 x 3.1 cm mass expands the LEFT seventh rib. No pathologic fracture. Maintenance of thoracic kyphosis. Intervertebral disc heights preserved. Approximate RIGHT seventh rib metastasis present. CORD: Thoracic spinal cord is normal morphology and signal characteristics to the level the conus medullaris which terminates at T12-L1. Prominent dorsal epidural fat mildly effaces the thecal sac. PREVERTEBRAL AND PARASPINAL SOFT TISSUES: Tumor extends into the prevertebral space at C6-7. Mild symmetric paraspinal muscle atrophy. DEGENERATIVE CHANGE: No significant disc bulge, canal stenosis or neural foraminal narrowing at any level though, axial sequences are motion degraded. MRI LUMBAR SPINE FINDINGS OSSEOUS STRUCTURES: Load T1 and bright T2 signal replacing the normal bone marrow signal of L1. No additional lumbar metastasis identified. Lumbar vertebral bodies intact and aligned with maintenance of lumbar lordosis. Severe L5-S1 disc height loss, and marginal spurring compatible with degenerative discs. Remaining disc heights maintained with decreased T2 signal compatible with mild disc desiccation. Moderate chronic discogenic  endplate change at L5. No pathologic fracture or acute osseous process.a included view of the pelvis demonstrates RIGHT iliac metastasis. SPINAL CORD: Conus medullaris terminates at L1 and appears normal morphology and signal characteristics. 12 x 8 x 20 mm intradural low T1, bright T2 mass at L1-2 is slightly larger than prior examination though, not tailored for evaluation by noncontrast examination. Mass displaces the cauda equina. SOFT TISSUES: Mild symmetric paraspinal muscle atrophy. LEFT paraspinal muscle edema. LEVEL BY LEVEL EVALUATION: W2-9: Intra canalicular tumor results since severe thecal sac effacement, AP dimension of the thecal sac is 3 mm. Mild facet arthropathy and ligamentum flavum redundancy without osseous canal stenosis. No neural foraminal narrowing. L2-3 and L3-4: Annular bulging, moderate facet arthropathy and ligamentum flavum redundancy without canal stenosis or neural foraminal narrowing. L4-5: Annular bulging. Severe facet arthropathy and ligamentum flavum redundancy result in severe canal stenosis. Mild bilateral neural foraminal narrowing. L5-S1: Broad-based RIGHT central disc protrusion on background of broad-based disc bulge results in mild canal stenosis. Effaced RIGHT lateral recess posteriorly displaces the traversing RIGHT S1 nerve. Mild facet arthropathy and ligamentum flavum redundancy. Mild RIGHT, moderate to severe LEFT neural foraminal narrowing. IMPRESSION: MR THORACIC SPINE IMPRESSION Diffuse osseous metastasis including bilateral ribs metastasis without pathologic fracture. No acute osseous process or malalignment. No significant neurocompressive changes. MR LUMBAR SPINE IMPRESSION L1 metastasis. No pathologic fracture nor acute osseous process. No malalignment. 12 x 8 x 20 mm intradural mass at L1-2 appears slightly larger, corresponding to known tumor. This would be better characterized on postcontrast imaging. Tumor results in severe thecal sac effacement at L1-2.  Mild osseous canal stenosis L5-S1. Moderate to severe LEFT L5-S1 neural foraminal narrowing. Electronically Signed   By: Thana Farr.D.  On: 07/24/2015 22:18   Rogue Bussing, MD 07/26/2015, 7:33 AM PGY-1, [place] Intern pager: [phone], text pages welcome

## 2015-07-26 NOTE — Progress Notes (Signed)
PT Cancellation Note  Patient Details Name: James Small MRN: [id number] DOB: 31-Jul-1922   Cancelled Treatment:    Reason Eval/Treat Not Completed: Per Andrena Mews, MD, hold PT until pain better controlled. Please re-order when appropriate.    Cassell Clement, PT, CSCS Pager [phone] Office [phone]  07/26/2015, 1:19 PM

## 2015-07-26 NOTE — Progress Notes (Addendum)
Pt got agitated, confuse and combative.  Haldol was given with minimal relief. Continues to pulls lines and tubes, trying to get out of the bed, kicking and keeps yelling out "Mama." New IV was inserted, IV fentanyl was given. Pt starting to calm down. Recent VSS. T=98.8, P=99, BP=115/87, RR=22, O2=99 RA. Notified on call MD updated pt's status. Will continue to monitor.

## 2015-07-26 NOTE — Consult Note (Signed)
Consultation Note Date: 07/26/2015   Patient Name: James Small  DOB: 16-Jan-1923  MRN: [id number]  Age / Sex: 82 y.o., male  PCP: James Skeens, MD Referring Physician: Zenia Resides, MD  Reason for Consultation: Establishing goals of care and Pain control  HPI/Patient Profile: 82 y.o. male  with past medical history of BPH, back pain, intradural mass, HTN, hypothyroidism, dyslipidemia, coronary atherosclerosis admitted on 07/24/2015 with back pain resulting from a fall at home. 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 Per neurosurgery's note there appears to be a compression fracture at L1 that is contributing to patient's significant pain.  Met with patient's son, James Small; and daughter, James Small; at bedside. Prior to fall patient was independent home with all ADL's including financial matters. His wife of over 87 years died from dementia in 31 and James Small had been her caretaker. 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 He has been receiving steroid injections for a mass on his spine that was considered benign for the past five years. Daughter, James Small, notes he has lost significant amount of weight over the past 3 months but had improvement with Megace. Otherwise, he has had no complaints and has good functional status.  Clinical Assessment and Goals of Care:  At time of consult James Small was in bed with altered mental status, frequently calling out, and appeared to be in pain with tense body and furrowed brow. He was gripping the sides of the bed and stating what sounded like, "No more". Daughter and son would like to immediately focus on making patient comfortable and decreasing his pain. He is currently a full  code due to before his altered mental status he stated that was his preference. However, James Small and James Small agree that he would not want long term ventilation or other life prolonging measures if he was not going to regain functional status. James Putt, NP clarified with James Small children what full code meant and they reasserted their desire to maintain full code. They do agree that no further workup should be done to identify etiology of bone metastasis as patient would likely not tolerate workup or further treatment of identified cancer. They also would not desire any surgical procedures.   NEXT OF KIN- Daughter- James Small; Son- James Small    SUMMARY OF RECOMMENDATIONS    Code Status/Advance Care Planning:  Full code   Will continue to discuss patient's code status with patient's children and patient should his mental status return to baseline  Although patient is likely eligible for Hospice- family is not interested at this time; will continue to approach with family    Symptom Management:   Pain- agree with neurosurgery's addition of decadron, continue prn 12.5 fentanyl q2hr, continue lidoderm patch; Lyrica '25mg'$  daily started if pt can tolerate PO medications  Delirium- start Haldol '1mg'$  q6hr prn agitation  Palliative Prophylaxis:   Delirium Protocol, Frequent Pain Assessment and Oral Care  Additional Recommendations (Limitations, Scope, Preferences):  No long term intubation or life prolonging measures, will further clarify (ie tube feeding, IV fluids) at future meetings   Psycho-social/Spiritual:   Desire for further Chaplaincy support: Will offer at next meeting  Additional Recommendations: Education on Hospice  Prognosis:  Unable to determine- extremely poor due to patient's diffuse metastasis, altered mental status, no po intake; if mental status does not return to baseline, may be days to weeks  Discharge Planning: To Be Determined discussed facility placement vs Hospice  facility with patient's children. Currently, they decline Hospice services but will continue to address as hospital course progresses        Primary Diagnoses: Present on Admission:  . Back injury . Back pain  I have reviewed the medical record, interviewed the patient and family, and examined the patient. The following aspects are pertinent.  Past Medical History  Diagnosis Date  . Inguinal hernia     left  . Thyroid disease   . Dyslipidemia   . BPH (benign prostatic hyperplasia)   . Coronary artery disease     status post cabg in 1996 by James Small with LIMA to the LAD, SVG to D1 and D2, SVG to circumflex, SVG to right coronary artery  . Hypertension   . Hypothyroidism   . Chronic kidney disease   . Presence of indwelling urinary catheter   . Weight loss     25 lbs over last few months per pt   . Arthritis     lower back - pt has epidural injections every few months   . Cancer [facility])     metasatic / spine    Social History   Social History  . Marital Status: Married    Spouse Name: N/A  . Number of Children: N/A  . Years of Education: N/A   Social History Main Topics  . Smoking status: Never Smoker   . Smokeless tobacco: Never Used  . Alcohol Use: No  . Drug Use: No  . Sexual Activity: Not Asked   Other Topics Concern  . None   Social History Narrative   Family History  Problem Relation Age of Onset  . Family history unknown: Yes   Scheduled Meds: . aspirin EC  81 mg Oral BID  . atenolol  25 mg Oral Daily  . atorvastatin  10 mg Oral QHS  . dexamethasone  10 mg Intravenous Q6H  . enoxaparin (LOVENOX) injection  30 mg Subcutaneous Q24H  . levothyroxine  150 mcg Oral QAC breakfast  . lidocaine  1 patch Transdermal Q24H  . loratadine  10 mg Oral Daily  . megestrol  120 mg Oral BID  . pregabalin  25 mg Oral Daily   Continuous Infusions: . sodium chloride Stopped (07/24/15 1334)  . dextrose 5 % and 0.9% NaCl 125 mL/hr at 07/26/15 0536   PRN  Meds:.acetaminophen **OR** acetaminophen, baclofen, fentaNYL (SUBLIMAZE) injection, haloperidol lactate Medications Prior to Admission:  Prior to Admission medications   Medication Sig Start Date End Date Taking? Authorizing Provider  aspirin EC 81 MG tablet Take 81 mg by mouth 2 (two) times daily.   Yes Historical Provider, MD  atorvastatin (LIPITOR) 10 MG tablet Take 10 mg by mouth at bedtime.    Yes Historical Provider, MD  cetirizine (ZYRTEC) 10 MG tablet Take 10 mg by mouth daily.   Yes Historical Provider, MD  ibuprofen (ADVIL,MOTRIN) 200 MG tablet  Take 800 mg by mouth every 6 (six) hours as needed for mild pain.   Yes Historical Provider, MD  levothyroxine (SYNTHROID, LEVOTHROID) 150 MCG tablet Take 150 mcg by mouth daily before breakfast.   Yes Historical Provider, MD  megestrol (MEGACE) 400 MG/10ML suspension Take by mouth 2 (two) times daily.   Yes Historical Provider, MD  Multiple Vitamins-Minerals (MULTI COMPLETE PO) Take 1 tablet by mouth daily.    Yes Historical Provider, MD  atenolol (TENORMIN) 50 MG tablet TAKE 1/2 TABLET BY MOUTH ONCE DAILY 07/24/15   Minus Breeding, MD   No Known Allergies Review of Systems  Unable to perform ROS   Physical Exam  Constitutional:  Distressed appearing white male, lying in bed  Pulmonary/Chest: Effort normal. No respiratory distress.  Genitourinary:  Foley in place  Neurological:  Does not respond to questions, disoriented  Skin: Skin is warm and dry.  Scattered bruises on arms  Psychiatric:  Delirium, does not follow directions    Vital Signs: BP 115/87 mmHg  Pulse 98  Temp(Src) 98.8 F (37.1 C) (Oral)  Resp 18  SpO2 100% Pain Assessment: PAINAD   Pain Score: Asleep   SpO2: SpO2: 100 % O2 Device:SpO2: 100 % O2 Flow Rate: .   IO: Intake/output summary:  Intake/Output Summary (Last 24 hours) at 07/26/15 0732 Last data filed at 07/26/15 0600  Gross per 24 hour  Intake 2095.83 ml  Output   1175 ml  Net 920.83 ml     LBM: Last BM Date: 07/23/15 Baseline Weight:   Most recent weight:       Palliative Assessment/Data:   Flowsheet Rows        Most Recent Value   Intake Tab    Referral Department  -- [Teaching [place]   Unit at Time of Referral  Med/Surg Unit   Palliative Care Primary Diagnosis  Neurology   Clinical Assessment    Psychosocial & Spiritual Assessment    Palliative Care Outcomes       Time In: 1300 Time Out: 1415 Time Total: 75 minutes Greater than 50%  of this time was spent counseling and coordinating care related to the above assessment and plan.  Thank you for the consult.  Signed by: Mariana Kaufman, AGNP-C Vinie Sill, NP  Please contact Palliative Medicine Team phone at [phone] for questions and concerns.  For individual provider: See Shea Evans

## 2015-07-26 NOTE — Progress Notes (Signed)
Initial Nutrition Assessment  DOCUMENTATION CODES:   Not applicable  INTERVENTION:   -RD will follow for diet advancement and supplement diet as appropriate -If prolonged NPO status is anticipated, consider alternative means of nutrition/hydration if within pt's goals of care  NUTRITION DIAGNOSIS:   Inadequate oral intake related to inability to eat as evidenced by NPO status.  GOAL:   Patient will meet greater than or equal to 90% of their needs  MONITOR:   Diet advancement, Labs, Weight trends, Skin, I & O's  REASON FOR ASSESSMENT:   Consult  (pt unable to sit upgright to take PO)  ASSESSMENT:   James Small is a 82 y.o. male with hx of herniated disc and thecal mass brought in by EMS complaining of back pain after fall 1 day ago.  Pt admitted with back pain. Per MD notes, x-ray of lumbar and thoracic spine showed changes consistent with ankylosing spondylitis.   Unable to complete Nutrition-Focused physical exam at this time. At this time, pt is agitated and following minimal commands.   RD was consulted due to pt being unable to sit upright to take PO's. SLP was also consulted for recommendations.   Case discussed extensively with Palliative Care Nurse Practitioner. She reports that agitation and delirium have improved since yesterday, however, pt is still unready to take PO's or undergo swallow evaluation today. She reports medications have also been adjusted to hopefully improve with delirium and pain. At this time, pt's family is still hopeful for improvement. She shares that PTA, pt was fairly active and independent, living alone. Pt consumed breakfast out with his daughter and received meals prepared by his son, who enjoys cooking. Pt was eating well PTA. He had a distant hx of weight loss over the past several years and was on Megace to help stimulate appetite. Per palliative care NP, pt actually voiced that pt had "gained too much weight". Reviewed wt hx, which  revealed wt stability over the past year.   Also discussed nutrition care plan with palliative care NP. She reports that pt will remain NPO, due to inability to take PO's at this time. She shared that pt family will likely not be agreeable to alternative means of nutrition and pt would likely not tolerate feeding tube placement at this time. This RD also expressed concern over high aspiration risk if pt unable to be positioned upright, which NP agreed with. TPN would also be contraindicated due to pt with functioning GI tract.   Labs reviewed.   Diet Order:  Diet NPO time specified Except for: Sips with Meds  Skin:  Reviewed, no issues  Last BM:  07/23/15  Height:   Ht Readings from Last 1 Encounters:  07/19/15 5\' 8"  (1.727 m)    Weight:   Wt Readings from Last 1 Encounters:  07/19/15 181 lb 9.6 oz (82.373 kg)    Ideal Body Weight:  70 kg  BMI:  Estimated body mass index is 27.62 kg/(m^2) as calculated from the following:   Height as of 07/19/15: 5\' 8"  (1.727 m).   Weight as of 07/19/15: 181 lb 9.6 oz (82.373 kg).  Estimated Nutritional Needs:   Kcal:  1600-1800  Protein:  80-90 grams  Fluid:  1.6-1.8 L  EDUCATION NEEDS:   No education needs identified at this time  James Small A. Jimmye Norman, RD, LDN, CDE Pager: [phone] After hours Pager: [phone]

## 2015-07-26 NOTE — Progress Notes (Signed)
Occupational Therapy Evaluation Patient Details Name: James Small MRN: [id number] DOB: 28-Nov-1922 Today's Date: 07/26/2015    History of Present Illness 82 year old male admitted following a mechanical fall at home. 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 Patient chronically limited by back pain with some lower extremity symptoms. Patient with chronic urinary incontinence status post placement of suprapubic catheter. No new obvious complaints of sensory loss or weakness. Workup also demonstrates metastatic disease extensively through multiple vertebral bodies and paravertebral in his thoracic spine and L1 vertebral body. Per neurosurgery, Pt with some degree of L L1 compression fx.    Clinical Impression   Evaluation limited by apparent delirium/pain. Attempted bed mobility however, pt unable to tolerate at this time. Premedicated prior to session. Not following commands. PTA, pt lived alone and was mod A with ADL and mobility @ RW level. Pt currently requires Total assist for ADL and will need +2 assist for mobility.  Pt will need rehab at SNF to maximize functional level of independence. Discussed importance of mobility with daughter who was asking about her father using a back brace. Will follow acutely to address established goals.     Follow Up Recommendations  SNF;Supervision/Assistance - 24 hour    Equipment Recommendations  Other (comment) (TBA)    Recommendations for Other Services       Precautions / Restrictions Precautions Precautions: Back;Fall Precaution Booklet Issued: No      Mobility Bed Mobility Overal bed mobility: +2 for physical assistance;Needs Assistance             General bed mobility comments: attempted bed mobility, pt unable to tolerate. will need +2  Transfers Overall transfer level: Needs assistance                General transfer comment: unable at this time    Balance Overall balance assessment: History of Falls                                          ADL Overall ADL's : Needs assistance/impaired                                     Functional mobility during ADLs:  (unable to assess due to pain) General ADL Comments: total A  Pt has mitts on. Grabbing at groin area. Supra pubic catheter in place.      Vision     Perception     Praxis      Pertinent Vitals/Pain Pain Assessment: Faces Faces Pain Scale: Hurts even more Pain Location: unable to state Pain Descriptors / Indicators: Grimacing;Moaning Pain Intervention(s): Limited activity within patient's tolerance     Hand Dominance Right   Extremity/Trunk Assessment Upper Extremity Assessment Upper Extremity Assessment: Generalized weakness   Lower Extremity Assessment Lower Extremity Assessment: Defer to PT evaluation   Cervical / Trunk Assessment Cervical / Trunk Assessment: Other exceptions   Communication Communication Communication: HOH   Cognition Arousal/Alertness: Lethargic Behavior During Therapy: Restless;Impulsive;Agitated Overall Cognitive Status: Impaired/Different from baseline Area of Impairment: Orientation;Attention;Memory;Following commands;Safety/judgement;Awareness;Problem solving Orientation Level: Disoriented to;Place;Time;Situation Current Attention Level: Focused Memory: Decreased recall of precautions;Decreased short-term memory   Safety/Judgement: Decreased awareness of safety;Decreased awareness of deficits Awareness: Intellectual Problem Solving: Slow processing;Decreased initiation;Difficulty  sequencing;Requires verbal cues;Requires tactile cues General Comments: not following commands   General Comments   Daughter states "he didn't want to go to a place, but we are going to have to do something to bridge the gap until he can go home"    Exercises        Shoulder Instructions      Home Living Family/patient expects to be discharged to:: Skilled nursing facility                                        Prior Functioning/Environment Level of Independence: Independent with assistive device(s) (recently started using RW )        Comments: Pt lived alone and family checked on him several times/day. Family assisted with running errands    OT Diagnosis: Generalized weakness;Cognitive deficits;Acute pain   OT Problem List: Decreased strength;Decreased range of motion;Decreased activity tolerance;Impaired balance (sitting and/or standing);Decreased cognition;Decreased safety awareness;Decreased knowledge of use of DME or AE;Decreased knowledge of precautions;Pain   OT Treatment/Interventions: Self-care/ADL training;Therapeutic exercise;DME and/or AE instruction;Therapeutic activities;Cognitive remediation/compensation;Patient/family education;Balance training    OT Goals(Current goals can be found in the care plan section) Acute Rehab OT Goals Patient Stated Goal: family is for pt to eventually return home OT Goal Formulation: With patient/family Time For Goal Achievement: 08/09/15 Potential to Achieve Goals: Fair  OT Frequency: Min 2X/week   Barriers to D/C:            Co-evaluation              End of Session Nurse Communication: Other (comment) (Family requesting something for agitation)  Activity Tolerance: Patient limited by pain Patient left: in bed;with call bell/phone within reach;with bed alarm set;with family/visitor present   Time: 1140-1155 OT Time Calculation (min): 15 min Charges:  OT General Charges $OT Visit: 1 Procedure OT Evaluation $OT Eval Moderate Complexity: 1 Procedure G-Codes:    Cabela Pacifico,HILLARY 08-16-15, 1:30 PM   Maurie Boettcher, OTR/L  [phone] 08-16-15

## 2015-07-27 LAB — BASIC METABOLIC PANEL
ANION GAP: 11 (ref 5–15)
BUN: 24 mg/dL — ABNORMAL HIGH (ref 6–20)
CHLORIDE: 115 mmol/L — AB (ref 101–111)
CO2: 15 mmol/L — AB (ref 22–32)
Calcium: 8.3 mg/dL — ABNORMAL LOW (ref 8.9–10.3)
Creatinine, Ser: 1.99 mg/dL — ABNORMAL HIGH (ref 0.61–1.24)
GFR calc Af Amer: 32 mL/min — ABNORMAL LOW (ref >60)
GFR, EST NON AFRICAN AMERICAN: 28 mL/min — AB (ref >60)
GLUCOSE: 216 mg/dL — AB (ref 65–99)
POTASSIUM: 3.9 mmol/L (ref 3.5–5.1)
Sodium: 141 mmol/L (ref 135–145)

## 2015-07-27 LAB — CBC
HEMATOCRIT: 38.2 % — AB (ref 39.0–52.0)
HEMOGLOBIN: 12.5 g/dL — AB (ref 13.0–17.0)
MCH: 29.8 pg (ref 26.0–34.0)
MCHC: 32.7 g/dL (ref 30.0–36.0)
MCV: 91.2 fL (ref 78.0–100.0)
Platelets: 217 10*3/uL (ref 150–400)
RBC: 4.19 MIL/uL — AB (ref 4.22–5.81)
RDW: 13.7 % (ref 11.5–15.5)
WBC: 19.3 10*3/uL — AB (ref 4.0–10.5)

## 2015-07-27 LAB — GLUCOSE, CAPILLARY: Glucose-Capillary: 215 mg/dL — ABNORMAL HIGH (ref 65–99)

## 2015-07-27 MED ORDER — SODIUM CHLORIDE 0.9 % IV SOLN
INTRAVENOUS | Status: DC
  Administered 2015-07-27 – 2015-07-29 (×5): via INTRAVENOUS

## 2015-07-27 NOTE — Progress Notes (Signed)
Suprapubic cath was replace as per MD. Order.A Bardex 20 Fr. Was place in.Pt. Tolerated well.Urine return was noticed

## 2015-07-27 NOTE — Care Management Important Message (Signed)
Important Message  Patient Details  Name: James Small MRN: [id number] Date of Birth: 03-Jan-1923   Medicare Important Message Given:  Yes    Loann Quill 07/27/2015, 10:02 AM

## 2015-07-27 NOTE — Progress Notes (Signed)
SLP Cancellation Note  Patient Details Name: James Small MRN: [id number] DOB: [birth year]   Cancelled treatment:       Reason Eval/Treat Not Completed: Medical issues which prohibited therapy;Fatigue/lethargy limiting ability to participate (pt RR is 28 with deep abdominal breathing, pt reaching up and repeating incomprehensible phrase, not appropriate for po trials, will continue efforts)   Luanna Salk, [place] [facility] [phone]

## 2015-07-27 NOTE — Progress Notes (Signed)
PT Cancellation Note  Patient Details Name: James Small MRN: [id number] DOB: [birth year]   Cancelled Treatment:    Reason Eval/Treat Not Completed: Patient not medically ready.  Per MD note "Hold on PT/OT until pain improved".  Will hold PT eval at this time.  Please advise if pt is appropriate for PT and mobility or if plan becomes purely comfort.     Catarina Hartshorn, [place] 07/27/2015, 10:32 AM

## 2015-07-27 NOTE — Progress Notes (Signed)
Inpatient Diabetes Program Recommendations  AACE/ADA: New Consensus Statement on Inpatient Glycemic Control (2015)  Target Ranges:  Prepandial:   less than 140 mg/dL      Peak postprandial:   less than 180 mg/dL (1-2 hours)      Critically ill patients:  140 - 180 mg/dL   Results for JOURDAIN, VIELMAN (MRN [id number]) as of 07/27/2015 11:12  Ref. Range 07/26/2015 03:37 07/27/2015 04:28  Glucose Latest Ref Range: 65-99 mg/dL 240 (H) 216 (H)   Review of Glycemic Control  Diabetes history: No Outpatient Diabetes medications: NA Current orders for Inpatient glycemic control: None  Inpatient Diabetes Program Recommendations: Correction (SSI): Noted patient is ordered Decadron 10 mg Q6H which is likely cause of hyperglycemia. If appropriate, while ordered steroids, please consider ordering CBGs with Novolog sensitive correction Q4H.  Thanks, Barnie Alderman, RN, MSN, CDE Diabetes Coordinator Inpatient Diabetes Program [phone] (Team Pager from [place] to [place]) [phone] ([place] office) [phone] [facility] office) [phone] [facility] office)

## 2015-07-27 NOTE — Progress Notes (Signed)
Family Medicine Teaching Service Daily Progress Note Intern Pager: [phone]  Patient name: James Small Medical record number: [id number] Date of birth: 08-30-1922 Age: 82 y.o. Gender: male  Primary Care Provider: Limmie Patricia, MD Consultants: Palliative Care, Neurosurgery Code Status: Full (confirmed upon admission)  Pt Overview and Major Events to Date:  6/12: Patient presents with worsening pain after fall  Assessment and Plan: James Small is a 82 y.o. male presenting with back pain x 1 day after fall. 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  # Back pain: Worsened since fall 6/11. Is able to move legs and pull himself to the side without crying out but is delirious. 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 Reviewed by Neurosurgeon Dr. Annette Stable who noted L1 compression fracture. Xray lumbar and thoracic spine showed changes consistent with ankylosing spondylitis (Denies previous history). Diffuse osteopenia and degenerative changes of thoracic spine. No radiculopathy on initial exam. No loss of bowel function.No saddle anesthesia.  - Will need goals of care discussion to decide extent of further workup - Consulted Palliative Care for assistance with pain management and further goals of care discussion. Appreciate recommendations --> Added Lyrica if patient is able to tolerate PO. Agreed with steroids. Added scheduled oral haldol 07/26/15 as IV seemed to help his delirium. Confirmed with family they want patient to remain Full Code and that they are not interested in pursuing workup of metastatic disease at this time.   - Consulted Neurosurgery for advice on stabilization/possible treatment options to offer family --> Suspect L1 compression fracture is causing most of patient's acute pain.  Hope for improvement over days to weeks to come. Recommended IV decadron 10 mg q6h. May need lumbar corset if starts to mobilize. 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 Annette Stable does not recommend surgical excision given patient's age, functional status, and previously wishes not to pursue surgery - Obtain PSA --> Elevated at 129.23.  - Ordered lidocaine patch for non-sedating, local pain control - Fentanyl 12.5 mcg q2h prn for pain, dose decreased from initial 33m due to somnolence (7 doses yesterday) - Baclofen switched from 5 mg TID scheduled to prn (no doses yesterday) - Will avoid NSAID use at this time due to renal function. - Hold on PT/OT until pain improved - Ordered Nutrition and SLP consults for recommendations --> NPO until delirium improves - Will try increased dose of haldol prn for agitation (received 1 dose overnight)  # Delirium with agitation: Frequently tachycardic. Contributing factors include pain, pain medications, hospitalization in elderly patient. - Started scheduled haldol 1 mg PO BID. - Prn IV haldol 2 mg q6h prn (1 dose given overnight) - Encouraged family to speak with him and try to orient him - Keep blind open during day  # AKI in setting of CKD: Improved. 1.99 < 2.19 < 2.14 < 2.26. Baseline SCr ~ 2.0 (according to labs with SCr 2.07 from 06/26/15). Daughter says renal function has been stable with indwelling suprapubic cath. May be due to dehydration from decreased PO 2/2 pain, but initial BUN:Cr ratio < 20. - Avoid nephrotoxic medications, including NSAIDs - Continue IVFs while pain limits adequate PO intake. Discontinue once pain is better controlled and patient able to sit up and eat/drink. - AM BMPs  # Hyperglycemia: Patient with history of pre-diabetes. Giving D5NS  and decadron.  - Will switch to NS for fluids. - Check CBG this afternoon to ensure patient is not hypoglycemic without D5  # Leukocytosis: Increased in setting of  steroid administration. Remains afebrile. WBC 19.3 < 13.2 < 13.8 < 15.4 on admission, previously elevated to 10.9 with last labs 06/26/15. UA with leukocytosis and micro with bacteria in setting of indwelling cath. Suspect chronic colonization. McGeer's Criteria not met, with patient afebrile without rigors or hypotension, no changes in mental status prior to receiving pain medications, and no purulent discharge.  - Continue to monitor daily CBCs.  - Due for suprapubic cath replacement today; contacted specialty team - Antibiotics not indicated, consider if clinically changes  # BPH and neurogenic bladder: Not medically managed. Has indwelling catheter.  - Flush catheter three times weekly - Request suprapubic catheter change today, 6/15 (has been 1 month since placed)  # CAD s/p CABG in 1996: Follows with Dr. Percival Spanish. Seen 07/18/15 and no medication changes suggested. Myoview from 04/2013 showed no high risk findings.  - Continue home aspirin 81 mg BID - Continue home atorvastatin 10 mg QHS  # Hypothyroidism: - Continue IV synthroid 75 mg for home synthroid 150 mcg while NPO  # Poor appetite: - Continue home megace 3 mL BID when eating  #Hemoglobin drop: Improved. 13.5 on admission > 11.2 > 12.5. May have been dilutional.  - Continue to monitor  FEN/GI: NPO while sedated, attempt meds with sips, NS @ 125 mL/hr, oral care  Prophylaxis: Lovenox (Renally Dosed)  Disposition: Admitted for pain management and placement evaluation.   Subjective:  Patient still confused and trying to take mitts off this morning.  Objective: Temp:  [97.7 F (36.5 C)-98.2 F (36.8 C)] 97.7 F (36.5 C) (06/15 0500) Pulse Rate:  [93-118] 111 (06/15 0500) Resp:  [18-20] 20 (06/15 0500) BP: (114-135)/(55-92) 117/70 mmHg (06/15 0500) SpO2:  [93 %-97 %] 94 % (06/15 0500) Physical Exam: General: Elderly male, lying in bed with eyes open, trying to take mitts off Cardiovascular: RRR, S1, S2, no  m/r/g Respiratory: CTAB, no increased WOB Abdomen: +BS, S, ND, groaned with abdominal palpation, suprapubic catheter in place MSK: Normal tone. Moving all extremities spontaneously. Could not follow commands to test strength.  Skin: 3 cm healing laceration over right elbow Neuro: Awake. Unintelligible speech.  Psych: Altered and confused.   Laboratory:  Recent Labs Lab 07/25/15 0447 07/26/15 0337 07/27/15 0428  WBC 13.8* 13.2* 19.3*  HGB 12.3* 11.2* 12.5*  HCT 39.0 35.0* 38.2*  PLT 166 170 217    Recent Labs Lab 07/25/15 0447 07/26/15 0337 07/27/15 0428  NA 141 140 141  K 5.0 3.8 3.9  CL 110 113* 115*  CO2 23 15* 15*  BUN 30* 26* 24*  CREATININE 2.14* 2.19* 1.99*  CALCIUM 8.8* 8.3* 8.3*  GLUCOSE 124* 240* 216*   Imaging/Diagnostic Tests: No results found.   MRI thoracic and lumbar spine 07/24/15: MRI thoracic/lumbar spine shows metastases throughout thoracic verterbral bodies into left costovertebral junction with mass of L 7th rib and matastasis to R 7th rib, as well as tumor into prevertebral space at C6-C7, R iliac metastasis, and mass at L1-L2 slightly larger (12x8x20 mm vs 11x14x16 mm in Jan 2012) with effacement of thecal sac.   Rogue Bussing, MD 07/27/2015, 7:16 AM PGY-1, [place] Intern pager: [phone], text pages welcome

## 2015-07-27 NOTE — Progress Notes (Signed)
Daily Progress Note   Patient Name: James Small       Date: 07/27/2015 DOB: Aug 12, 1922  Age: 82 y.o. MRN#: [id number] Attending Physician: Zenia Resides, MD Primary Care Physician: Limmie Patricia, MD Admit Date: 07/24/2015  Reason for Consultation/Follow-up: Establishing goals of care and Pain control  Subjective: HPI: 82 y.o. male with past medical history of BPH, back pain, intradural mass, HTN, hypothyroidism, dyslipidemia, coronary atherosclerosis admitted on 07/24/2015 with back pain resulting from a fall at home. 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 Per neurosurgery's note there appears to be a compression fracture at L1 that is contributing to patient's significant pain. Met with patient's son, James Small; and daughter, James Small; at bedside. Prior to fall patient was independent home with all ADL's including financial matters. His wife of over 44 years died from dementia in 10 and [name] had been her caretaker. 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 He has been receiving steroid injections for a mass on his spine that was considered benign for the past five years. Daughter, James Small, notes he has lost significant amount of weight over the past 3 months but had improvement with Megace. Otherwise, he has had no complaints and has good functional status.  Interval history: Today patient remains in bed. Agitated at times per nursing report. Requiring prn pain and agitation medication approx q2-3 hours. He had just received PRN fentanyl and haldol prior to evaluation. He was arouseable during  our visit and did open mouth, stick out his tongue, and blink his eyes when commanded. He also groaned some "yes" and "no" responses appropriately, and was able to communicate a little more after some oral care. He was able to communicate he wanted the mitts off, and asked for his son. Speech was still very unclear and he continued to call out for "Papa" and moan. Nursing reported that before medication he was completely altered and agitated and did not respond to any commands. Situation was discussed with son, Zenia Resides. Noted that if patient doesn't show some improvement over weekend that there is a possibility that he may not recover from this incident. He is not eating  or drinking, sleeping most of the time, and in severe pain or delirium when awake. Reviewed how often with elderly population a fall can be the tipping point into end of life. Zenia Resides had not yet thought of this, but appeared to be receptive to beginning this conversation. Gave him "Hard Choices" book and encouraged him to read it and share with family members.    Length of Stay: 3  Current Medications: Scheduled Meds:  . aspirin EC  81 mg Oral BID  . atenolol  25 mg Oral Daily  . atorvastatin  10 mg Oral QHS  . dexamethasone  10 mg Intravenous Q6H  . enoxaparin (LOVENOX) injection  30 mg Subcutaneous Q24H  . haloperidol  1 mg Oral BID  . levothyroxine  75 mcg Intravenous Daily  . lidocaine  1 patch Transdermal Q24H  . loratadine  10 mg Oral Daily  . megestrol  120 mg Oral BID  . pregabalin  25 mg Oral Daily    Continuous Infusions: . sodium chloride 125 mL/hr at 07/27/15 1241    PRN Meds: acetaminophen **OR** acetaminophen, baclofen, fentaNYL (SUBLIMAZE) injection, haloperidol lactate  Physical Exam  Constitutional: He appears well-developed.  Distressed at times when awake  HENT:  Head: Normocephalic and atraumatic.  Right Ear: No decreased hearing is noted.  Left Ear: No decreased hearing is noted.  Dry mucosal  membranes, hearing better in left than right  Cardiovascular: Normal rate and regular rhythm.   Pulmonary/Chest: Effort normal and breath sounds normal.  Abdominal: Soft. Bowel sounds are normal.  Genitourinary:  Catheter in place- urine clear, yellow  Musculoskeletal:  Tense arms and hands when awake, bedbound  Neurological:  Disoriented, follows some commands  Skin:  Scattered bruising on arms  Psychiatric: Cognition and memory are impaired.  Altered/agitated at times, responds to some commands  Nursing note and vitals reviewed.           Vital Signs: BP 117/70 mmHg  Pulse 111  Temp(Src) 97.7 F (36.5 C) (Axillary)  Resp 20  SpO2 94% SpO2: SpO2: 94 % O2 Device: O2 Device: Not Delivered O2 Flow Rate:    Intake/output summary:   Intake/Output Summary (Last 24 hours) at 07/27/15 1335 Last data filed at 07/27/15 0957  Gross per 24 hour  Intake   2250 ml  Output   2350 ml  Net   -100 ml   LBM: Last BM Date: 07/23/15 Baseline Weight:   Most recent weight:         Palliative Assessment/Data:    Flowsheet Rows        Most Recent Value   Intake Tab    Referral Department  -- [teaching service]   Unit at Time of Referral  Med/Surg Unit   Palliative Care Primary Diagnosis  Cancer   Date Notified  07/25/15   Palliative Care Type  New Palliative care   Reason for referral  Pain, Clarify Goals of Care   Date of Admission  07/24/15   Date first seen by Palliative Care  07/25/15   # of days Palliative referral response time  0 Day(s)   # of days IP prior to Palliative referral  1   Clinical Assessment    Palliative Performance Scale Score  10%   Psychosocial & Spiritual Assessment    Social Work Plan of Care  Education on Hospice, Clarified patient/family wishes with healthcare team, Participated in Family meeting   Palliative Care Outcomes    Patient/Family meeting held?  Yes  Palliative Care Outcomes  Clarified goals of care, Counseled regarding hospice, Improved  pain interventions   Patient/Family wishes: Interventions discontinued/not started   Other (Comment), Tube feedings/TPN [no long term ventilation ]   Palliative Care follow-up planned  Yes, Facility   Palliative Care Follow-up Reason  Pain, Clarify goals of care      Patient Active Problem List   Diagnosis Date Noted  . Acute delirium   . Palliative care encounter   . Goals of care, counseling/discussion   . Bone metastases ([place])   . Bone mass   . Intradural mass ([place])   . Back injury 07/24/2015  . Back pain 07/24/2015  . Back injuries   . Fall   . Renal insufficiency   . High risk social situation   . ESSENTIAL HYPERTENSION, BENIGN 12/06/2008  . HYPOTHYROIDISM 12/01/2008  . DYSLIPIDEMIA 12/01/2008  . Coronary atherosclerosis 12/01/2008    Palliative Care Assessment & Plan   Patient Profile:   Assessment/Recommendation/Plan:  Pain:  improved control, likely resulting from L1 compression fracture and multiple rib and vertebral metastasis from cancer of unknown origin. Of note- his PSA was significantly elevated therefore Prostate is a likely etiology, however, family does not wish for further workup. Continue  fentanyl 12.'5mg'$  q 2 hours PRN, IV Dexamethasone, and transdermal lidocaine patches.   Delirium: unknown etiology. Appears to be improving with scheduled and PRN Haldol and dexamethasone. Continue with current medications and monitor.    Code status: family continues to desire FULL code status. Began discussion today with Zenia Resides to start family thinking about the possibility that this may be a terminal situation for Mr. Palecek. Daughter James Small was not present, and of note she has been very resistant to this. James Small continues to hope that her father will return to normal functional status and be able to return home. Will continue to gently approach Code status with family.  Goals of Care and Additional Recommendations:  Limitations on Scope of Treatment: No Artificial Feeding,  No Diagnostics, No Radiation, No Surgical Procedures and No Tracheostomy  Code Status:    Code Status Orders        Start     Ordered   07/24/15 1426  Full code   Continuous     07/24/15 1425    Code Status History    Date Active Date Inactive Code Status Order ID Comments User Context   This patient has a current code status but no historical code status.    Advance Directive Documentation        Most Recent Value   Type of Advance Directive  Living will   Pre-existing out of facility DNR order (yellow form or pink MOST form)     "MOST" Form in Place?         Prognosis:   Unable to determine- dependent on patient's mental status progression and/if any changes in oral intake  Discharge Planning:  [place] with Hospice vs. Hospice facility pending hospice evaluation d/t cancer with no further workup or treatment, and decreased functional status  Care plan was discussed with son, Zenia Resides. Will continue to follow.  Thank you for allowing the Palliative Medicine Team to assist in the care of this patient.    Time In: 1130 Time Out: 1220 Total Time 50 minutes Prolonged Time Billed  No       Greater than 50%  of this time was spent counseling and coordinating care related to the above assessment and plan.    Juanda Crumble,  Leonor Liv, NP  Vinie Sill, NP  Please contact Palliative Medicine Team phone at [phone] for questions and concerns.

## 2015-07-28 DIAGNOSIS — Z7189 Other specified counseling: Secondary | ICD-10-CM

## 2015-07-28 DIAGNOSIS — Z515 Encounter for palliative care: Secondary | ICD-10-CM

## 2015-07-28 DIAGNOSIS — N183 Chronic kidney disease, stage 3 unspecified: Secondary | ICD-10-CM | POA: Insufficient documentation

## 2015-07-28 LAB — BASIC METABOLIC PANEL
Anion gap: 15 (ref 5–15)
BUN: 29 mg/dL — AB (ref 6–20)
CO2: 18 mmol/L — ABNORMAL LOW (ref 22–32)
CREATININE: 2 mg/dL — AB (ref 0.61–1.24)
Calcium: 8.6 mg/dL — ABNORMAL LOW (ref 8.9–10.3)
Chloride: 112 mmol/L — ABNORMAL HIGH (ref 101–111)
GFR calc Af Amer: 32 mL/min — ABNORMAL LOW (ref >60)
GFR, EST NON AFRICAN AMERICAN: 27 mL/min — AB (ref >60)
GLUCOSE: 194 mg/dL — AB (ref 65–99)
POTASSIUM: 4.7 mmol/L (ref 3.5–5.1)
Sodium: 145 mmol/L (ref 135–145)

## 2015-07-28 LAB — CBC
HCT: 39.2 % (ref 39.0–52.0)
Hemoglobin: 13.2 g/dL (ref 13.0–17.0)
MCH: 30.3 pg (ref 26.0–34.0)
MCHC: 33.7 g/dL (ref 30.0–36.0)
MCV: 89.9 fL (ref 78.0–100.0)
PLATELETS: 266 10*3/uL (ref 150–400)
RBC: 4.36 MIL/uL (ref 4.22–5.81)
RDW: 13.8 % (ref 11.5–15.5)
WBC: 18.3 10*3/uL — ABNORMAL HIGH (ref 4.0–10.5)

## 2015-07-28 MED ORDER — LABETALOL HCL 5 MG/ML IV SOLN
20.0000 mg | Freq: Once | INTRAVENOUS | Status: AC
  Administered 2015-07-28: 20 mg via INTRAVENOUS
  Filled 2015-07-28 (×2): qty 4

## 2015-07-28 MED ORDER — LABETALOL HCL 5 MG/ML IV SOLN
5.0000 mg | Freq: Once | INTRAVENOUS | Status: AC
Start: 2015-07-28 — End: 2015-07-28
  Administered 2015-07-28: 5 mg via INTRAVENOUS
  Filled 2015-07-28: qty 4

## 2015-07-28 MED ORDER — ATENOLOL 25 MG PO TABS
25.0000 mg | ORAL_TABLET | Freq: Every day | ORAL | Status: DC
  Administered 2015-07-28 – 2015-08-01 (×5): 25 mg via ORAL
  Filled 2015-07-28 (×5): qty 1

## 2015-07-28 MED ORDER — RESOURCE THICKENUP CLEAR PO POWD
ORAL | Status: DC | PRN
  Filled 2015-07-28: qty 125

## 2015-07-28 MED ORDER — LABETALOL HCL 5 MG/ML IV SOLN
20.0000 mg | INTRAVENOUS | Status: DC | PRN
  Filled 2015-07-28: qty 4

## 2015-07-28 MED ORDER — LEVOTHYROXINE SODIUM 75 MCG PO TABS
150.0000 ug | ORAL_TABLET | Freq: Every day | ORAL | Status: DC
  Administered 2015-07-29 – 2015-08-01 (×4): 150 ug via ORAL
  Filled 2015-07-28 (×4): qty 2

## 2015-07-28 NOTE — Progress Notes (Signed)
Nutrition Follow-up  DOCUMENTATION CODES:   Not applicable  INTERVENTION:   -Magic Cup TID with meals  NUTRITION DIAGNOSIS:   Inadequate oral intake related to lethargy/confusion as evidenced by meal completion < 25%.  Progressing  GOAL:   Patient will meet greater than or equal to 90% of their needs  Progressing  MONITOR:   Supplement acceptance, Diet advancement, Labs, Weight trends, Skin, I & O's  REASON FOR ASSESSMENT:   Consult  (pt unable to sit upgright to take PO)  ASSESSMENT:   AMEEN CHICO is a 82 y.o. male with hx of herniated disc and thecal mass brought in by EMS complaining of back pain after fall 1 day ago.  Pt underwent repeat BSE this morning. Pt with delayed swallow and discoordinated swallow response. Per SLP note, pt was able to take nectar thick liquids with a lot of improvement. SLP hopeful swallow function will improve with mentation, however, pt daughter is doubtful that pt will take PO's.   Palliative care continues to follow for discussions regarding goals of care. Family continues to be hopeful for improvement, however, willing to engage in discussions if pt does not improve.   Labs reviewed: CBGS: 215.   Diet Order:  DIET - DYS 1 Room service appropriate?: Yes; Fluid consistency:: Nectar Thick  Skin:  Reviewed, no issues  Last BM:  07/28/15  Height:   Ht Readings from Last 1 Encounters:  07/19/15 5\' 8"  (1.727 m)    Weight:   Wt Readings from Last 1 Encounters:  07/19/15 181 lb 9.6 oz (82.373 kg)    Ideal Body Weight:  70 kg  BMI:  There is no weight on file to calculate BMI.  Estimated Nutritional Needs:   Kcal:  1600-1800  Protein:  80-90 grams  Fluid:  1.6-1.8 L  EDUCATION NEEDS:   No education needs identified at this time  Sione Baumgarten A. Jimmye Norman, RD, LDN, CDE Pager: [phone] After hours Pager: [phone]

## 2015-07-28 NOTE — Progress Notes (Addendum)
Daily Progress Note   Patient Name: James Small       Date: 07/28/2015 DOB: 1922/03/26  Age: 82 y.o. MRN#: [id number] Attending Physician: Zenia Resides, MD Primary Care Physician: Limmie Patricia, MD Admit Date: 07/24/2015  Reason for Consultation/Follow-up: Establishing goals of care and Pain control  Subjective: HPI: 82 y.o. male with past medical history of BPH, back pain, intradural mass, HTN, hypothyroidism, dyslipidemia, coronary atherosclerosis admitted on 07/24/2015 with back pain resulting from a fall at home. 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 Per neurosurgery's note there appears to be a compression fracture at L1 that is contributing to patient's significant pain. Met with patient's son, Antony Haste; and daughter, Collie Siad; at bedside. Prior to fall patient was independent home with all ADL's including financial matters. His wife of over 27 years died from dementia in 46 and Mr. Toral had been her caretaker. 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 He has been receiving steroid injections for a mass on his spine that was considered benign for the past five years. Daughter, Collie Siad, notes he has lost significant amount of weight over the past 3 months but had improvement with Megace. Otherwise, he has had no complaints and has good functional status.  Interval history: Patient is awake resting comfortably in bed. Son Zenia Resides is present at the bedside. Patient is noted to have had some degree of mental status improvement. Son Zenia Resides is very much encouraged by this. He has notified his  sister over the phone. Primary team is addressing elevated blood pressure and heart rate. see discussions below:   Length of Stay: 4  Current Medications: Scheduled Meds:  . aspirin EC  81 mg Oral BID  . atorvastatin  10 mg Oral QHS  . dexamethasone  10 mg Intravenous Q6H  . enoxaparin (LOVENOX) injection  30 mg Subcutaneous Q24H  . levothyroxine  75 mcg Intravenous Daily  . lidocaine  1 patch Transdermal Q24H  . loratadine  10 mg Oral Daily  . megestrol  120 mg Oral BID  . pregabalin  25 mg Oral Daily    Continuous Infusions: . sodium chloride 125 mL/hr at 07/28/15  0501    PRN Meds: acetaminophen **OR** acetaminophen, baclofen, fentaNYL (SUBLIMAZE) injection, haloperidol lactate, RESOURCE THICKENUP CLEAR  Physical Exam  Constitutional: He appears well-developed.  Sting comfortably. Awake and alert. Is able to answer a few yes/no type questions appropriately this morning.  HENT:  Head: Normocephalic and atraumatic.  Right Ear: No decreased hearing is noted.  Left Ear: No decreased hearing is noted.  Dry mucosal membranes, hearing better in left than right  Cardiovascular: Normal rate and regular rhythm.   Pulmonary/Chest: Effort normal and breath sounds normal.  Abdominal: Soft. Bowel sounds are normal.  Genitourinary:  Catheter in place- urine clear, yellow  Neurological:   awake, reasonably alert. Recognizes son present in the room. Follow some commands. Skin:  Scattered bruising on arms  Psychiatric: Cognition and memory are impaired.  Nursing note and vitals reviewed.           Vital Signs: BP 181/106 mmHg  Pulse 126  Temp(Src) 99.9 F (37.7 C) (Axillary)  Resp 26  SpO2 91% SpO2: SpO2: 91 % O2 Device: O2 Device: Not Delivered O2 Flow Rate:    Intake/output summary:   Intake/Output Summary (Last 24 hours) at 07/28/15 1208 Last data filed at 07/28/15 8891  Gross per 24 hour  Intake    375 ml  Output   2425 ml  Net  -2050 ml   LBM: Last BM Date:  07/23/15 Baseline Weight:   Most recent weight:         Palliative Assessment/Data:    Flowsheet Rows        Most Recent Value   Intake Tab    Referral Department  -- [teaching service]   Unit at Time of Referral  Med/Surg Unit   Palliative Care Primary Diagnosis  Cancer   Date Notified  07/25/15   Palliative Care Type  New Palliative care   Reason for referral  Pain, Clarify Goals of Care   Date of Admission  07/24/15   Date first seen by Palliative Care  07/25/15   # of days Palliative referral response time  0 Day(s)   # of days IP prior to Palliative referral  1   Clinical Assessment    Palliative Performance Scale Score  10%   Psychosocial & Spiritual Assessment    Social Work Plan of Care  Participated in Family meeting   Palliative Care Outcomes    Patient/Family meeting held?  Yes   Who was at the meeting?  Son, [facility]   Palliative Care Outcomes  Improved pain interventions, Improved non-pain symptom therapy   Patient/Family wishes: Interventions discontinued/not started   Mechanical Ventilation, BiPAP, Trach, PEG, Tube feedings/TPN, Hemodialysis   Palliative Care follow-up planned  Yes, Facility   Palliative Care Follow-up Reason  Pain, Non-pain symptom, Clarify goals of care      Patient Active Problem List   Diagnosis Date Noted  . Acute delirium   . Palliative care encounter   . Goals of care, counseling/discussion   . Bone metastases ([place])   . Bone mass   . Intradural mass ([place])   . Back injury 07/24/2015  . Back pain 07/24/2015  . Back injuries   . Fall   . Renal insufficiency   . High risk social situation   . ESSENTIAL HYPERTENSION, BENIGN 12/06/2008  . HYPOTHYROIDISM 12/01/2008  . DYSLIPIDEMIA 12/01/2008  . Coronary atherosclerosis 12/01/2008    Palliative Care Assessment & Plan   Patient Profile:   Assessment/Recommendation/Plan:  Pain:  improved control, likely resulting from  L1 compression fracture and multiple rib and vertebral  metastasis from cancer of unknown origin. Of note- his PSA was significantly elevated therefore Prostate is a likely etiology, however, family does not wish for further workup. Continue  fentanyl 12.37m q 2 hours PRN, IV Dexamethasone, and transdermal lidocaine patches.   Delirium: unknown etiology. Appears to be improving with scheduled and PRN Haldol and dexamethasone. Continue with current medications and monitor.   Code status/Goals of care: Discussed extensively with son AZenia Residesat the bedside. Discussed short-term versus long-term decision-making. Addressed at the short-term: Improvement of pain/delirium, patient able to be started on dysphagia 1 by mouth grade, more awake more alert, also has physical therapy evaluation pending. Long-term discussions: Patient is a 82year old gentleman with metastatic disease has several vertebral bodies affected with metastatic disease, unknown primary. Prostate is being suspected because of elevated PSA. Discussed with son about unknown primary and patient's overall frail poor functional status. Discussed about why there is a medical recommendation for establishment of DO NOT RESUSCITATE/DO NOT INTUBATE in great detail. He will discuss with his sister today. Remains full code for now.   According to the patient's son AZenia Resides the patient had a quality of life prior to this acute event that is worth preserving. He was functional, he was independent. He would go out to eat with his son AZenia Residesto various restaurants. He used to use a walker. He did his own laundry. Lives by himself. Explained about staying hopeful, making sure that the patient stays as well as he can for as long as he can but also having a plan for what to do with the patient is actively dying. Discussed extensively. All questions answered to the best of my ability. Continue to remain in touch, follow patient's disease trajectory, help guide appropriate decision-making.   Goals of Care and Additional  Recommendations:  Limitations on Scope of Treatment: No Artificial Feeding, No Diagnostics, No Radiation, No Surgical Procedures and No Tracheostomy  Code Status:    Code Status Orders        Start     Ordered   07/24/15 1426  Full code   Continuous     07/24/15 1425    Code Status History    Date Active Date Inactive Code Status Order ID Comments User Context   This patient has a current code status but no historical code status.    Advance Directive Documentation        Most Recent Value   Type of Advance Directive  Living will   Pre-existing out of facility DNR order (yellow form or pink MOST form)     "MOST" Form in Place?         Prognosis:   Unable to determine- dependent on patient's mental status progression and/if any changes in oral intake Appears guarded. Discharge Planning:  Skilled nursing facility with rehabilitation services, with palliative care services at discharge versus hospice residential facility depending on patient's disease trajectory. Continue to monitor. Care plan was discussed with son, AZenia Resides Will continue to follow.  Thank you for allowing the Palliative Medicine Team to assist in the care of this patient.    Time In: 1130 Time Out: 1205 Total Time 35 minutes Prolonged Time Billed  No       Greater than 50%  of this time was spent counseling and coordinating care related to the above assessment and plan.    ZLoistine Chance MD   [phone] Please contact Palliative Medicine Team phone at [phone]  for questions and concerns.

## 2015-07-28 NOTE — Progress Notes (Signed)
PT Cancellation and Discharge Note  Patient Details Name: James Small MRN: [id number] DOB: [birth year]   Cancelled Treatment:    Reason Eval/Treat Not Completed: PT screened, no needs identified, will sign off.  Pt continues with pain and lethargy, and now with elevated HR.  Will sign off PT and need new orders if becomes appropriate for PT an mobility.     Catarina Hartshorn, [place] 07/28/2015, 11:08 AM

## 2015-07-28 NOTE — Progress Notes (Signed)
Notified physician of patient's BP 164/100 manually and HR 124. Dr Emmaline Life returned call and ordered STAT EKG and IV labetalol. EKG obtained and labetalol administered. Will recheck BP and HR and continue to monitor patient.

## 2015-07-28 NOTE — Care Management Important Message (Signed)
Important Message  Patient Details  Name: James Small MRN: [id number] Date of Birth: May 28, 1922   Medicare Important Message Given:  Yes    Loann Quill 07/28/2015, 9:34 AM

## 2015-07-28 NOTE — Progress Notes (Signed)
MD made aware of elevated BP and heart rate

## 2015-07-28 NOTE — Progress Notes (Signed)
Speech Language Pathology Treatment:    Patient Details Name: CHATO LAATSCH MRN: [id number] DOB: 07/22/1922 Today's Date: 07/28/2015 Time: [id number] SLP Time Calculation (min) (ACUTE ONLY): 14 min  Assessment / Plan / Recommendation Clinical Impression  Pt demosntrates signs of probable delayed swallow with discoordinated swallow response, multiple swallows and delayed cough with thin liquids. Though pt was resistant to repeated trials of PO, SLP and daughter were able to encourage pt to drink nectar thick liquids which resulted in much improved function and no signs of aspiration, as did puree. Suspect mentation and dry oral cavity have impacted swallow trigger. Hopeful that pts swallow will improve as oral hydration and mentation improve. For now recommend nectar thick liquids and puree solids. Daughter is doubtful pt will accept PO. Will follow for progress.    HPI HPI: 82 year old male admitted following a mechanical fall at home. 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 Patient chronically limited by back pain with some lower extremity symptoms. Patient with chronic urinary incontinence status post placement of suprapubic catheter. No new obvious complaints of sensory loss or weakness. Workup also demonstrates metastatic disease extensively through multiple vertebral bodies and paravertebral in his thoracic spine and L1 vertebral body. Per neurosurgery, Pt with some degree of L L1 compression fx.       SLP Plan  Continue with current plan of care     Recommendations  Diet recommendations: Dysphagia 1 (puree) Liquids provided via: Cup;Straw Medication Administration: Whole meds with puree Supervision: Patient able to self feed Compensations: Slow rate;Small sips/bites             Oral Care Recommendations: Oral care BID Follow up Recommendations: 24 hour  supervision/assistance Plan: Continue with current plan of care     GO               [facility], MA CCC-SLP [id number]  Lynann Beaver 07/28/2015, 11:59 AM

## 2015-07-28 NOTE — Progress Notes (Signed)
Family Medicine Teaching Service Daily Progress Note Intern Pager: [phone]  Patient name: James Small Medical record number: [id number] Date of birth: Oct 23, 1922 Age: 82 y.o. Gender: male  Primary Care Provider: Limmie Patricia, MD Consultants: Palliative Care, Neurosurgery Code Status: Full (confirmed upon admission)  Pt Overview and Major Events to Date:  6/12: Patient presents with worsening pain after fall  Assessment and Plan: HARPER SMOKER is a 82 y.o. male presenting with back pain x 1 day after fall. 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  # Back pain: Worsened since fall 6/11. Pain improved today, with patient denying active back pain at rest. 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 Reviewed by Neurosurgeon Dr. Annette Stable who noted L1 compression fracture. Xray lumbar and thoracic spine showed changes consistent with ankylosing spondylitis (Denies previous history). Diffuse osteopenia and degenerative changes of thoracic spine. No radiculopathy on initial exam. No loss of bowel function.No saddle anesthesia. Majority of pain thought to be due to compression fracture. - Consulted Palliative Care for assistance with pain management and further goals of care discussion. Appreciate recommendations --> Added Lyrica for when patient is able to tolerate PO. Agreed with steroids. Confirmed with family they want patient to remain Full Code and that they are not interested in pursuing workup of metastatic disease at this time.   - Consulted Neurosurgery for advice on stabilization/possible treatment options to offer family --> Suspect L1 compression fracture is causing most of patient's acute pain. Hope for improvement over days to weeks to come. Recommended IV decadron 10 mg q6h. May need  lumbar corset if starts to mobilize. 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 Annette Stable does not recommend surgical excision given patient's age, functional status, and previously wishes not to pursue surgery - Obtain PSA --> Elevated at 129.23.  - Ordered lidocaine patch for non-sedating, local pain control - Fentanyl 12.5 mcg q2h prn for pain, dose decreased from initial '25mg'$  due to somnolence (6 doses yesterday) - Baclofen switched from 5 mg TID scheduled to prn (no doses yesterday) - Will avoid NSAID use at this time due to renal function. - Hold on PT/OT until pain improved - Reordered Speech consult, as patient is much more aware today - Prn haldol for agitation (received 2 doses overnight)  # Delirium with agitation: Improved today with patient able to be somewhat conversant. Contributing factors include pain, pain medications, hospitalization in elderly patient. - Discontinue scheduled haldol 1 mg PO BID due to improved mental status --> Add back if patient worsens - Prn IV haldol 2 mg q6h prn (2 doses given overnight) - Encouraged family to speak with him and try to orient him - Keep blinds open during day  #HTN with tachycardia: BP to 181/106 this a.m. Persistently tachycardic with vital checks.  - Given 5 mg IV labetolol overnight.  - Gave another dose of 20 mg IV labetolol. - Restart home atenolol 25 mg daily when tolerating diet.  - Vitals q2h with pulse oximetry  # AKI in setting of CKD: Resolved. 2.00 < 1.99 < 2.19 < 2.14 < 2.26. Baseline SCr ~ 2.0 (according to labs with SCr 2.07 from 06/26/15). Daughter says renal function has been stable with indwelling suprapubic cath. May be due to dehydration from decreased PO 2/2 pain, but initial BUN:Cr ratio < 20. -  Avoid nephrotoxic medications, including NSAIDs - Continue IVFs while pain limits adequate PO intake. Discontinue once pain is better controlled and patient able to sit up and eat/drink. - AM  BMPs  # Hyperglycemia: Patient with history of pre-diabetes. Giving NS and decadron.  - Switched to NS for fluids 6/15 - Follow with glucose on BMP - Will likely need SSI once eating while steroids are continued  # Leukocytosis: Increased in setting of steroid administration. Remains afebrile. WBC 18.3 < 19.3 < 13.2 < 13.8 < 15.4 on admission, previously elevated to 10.9 with last labs 06/26/15. UA with leukocytosis and micro with bacteria in setting of indwelling cath. Suspect chronic colonization. McGeer's Criteria not met, with patient afebrile without rigors or hypotension, no changes in mental status prior to receiving pain medications, and no purulent discharge.  - Continue to monitor daily CBCs.  - Suprapubic cath replaced 6/15 - Antibiotics not indicated, consider if clinically changes  # BPH and neurogenic bladder: Not medically managed. Has indwelling catheter.  - Flush catheter three times weekly  # CAD s/p CABG in 1996: Follows with Dr. Percival Spanish. Seen 07/18/15 and no medication changes suggested. Myoview from 04/2013 showed no high risk findings.  - Continue home aspirin 81 mg BID - Continue home atorvastatin 10 mg QHS  # Hypothyroidism: - Continue IV synthroid 75 mg for home synthroid 150 mcg while NPO --> switch back to PO once patient tolerating diet  # Poor appetite: - Continue home megace 3 mL BID when eating  #Hemoglobin drop: Improved. 13.5 on admission > 11.2 > 12.5. May have been dilutional.  - Continue to monitor  FEN/GI: NPO while sedated, attempt meds with sips, NS @ 125 mL/hr, oral care  Prophylaxis: Lovenox (Renally Dosed)  Disposition: Admitted for pain management and placement evaluation.   Subjective:  Patient able to answer questions today. Clearly stating, "I have to go to the bathroom." He denied back pain. Mitts still in use, as patient had been trying to pull IV last night. Patient also trying to pull gown over his head but could not explain why  when asked.   Objective: Temp:  [98.9 F (37.2 C)-99.9 F (37.7 C)] 99.9 F (37.7 C) (06/16 0739) Pulse Rate:  [122-132] 126 (06/16 0739) Resp:  [24-28] 26 (06/16 0739) BP: (138-181)/(74-116) 181/106 mmHg (06/16 0739) SpO2:  [91 %-94 %] 91 % (06/16 0739) Physical Exam: General: Elderly male, lying in bed with eyes open, trying to take mitt off Cardiovascular: Tachycardic, S1, S2, no m/r/g Respiratory: CTAB, no increased WOB Abdomen: +BS, S, ND, NT, suprapubic catheter in place MSK: Normal tone. Moving all extremities spontaneously. Lifted left leg on command with 5-/5 strength. Did not lift right leg when asked.  Skin: 3 cm healing laceration over right elbow Neuro: Awake. Somewhat slurred speech.  Psych: Still confused but conversant today and could express his needs.    Laboratory:  Recent Labs Lab 07/26/15 0337 07/27/15 0428 07/28/15 0640  WBC 13.2* 19.3* 18.3*  HGB 11.2* 12.5* 13.2  HCT 35.0* 38.2* 39.2  PLT 170 217 266    Recent Labs Lab 07/26/15 0337 07/27/15 0428 07/28/15 0640  NA 140 141 145  K 3.8 3.9 4.7  CL 113* 115* 112*  CO2 15* 15* 18*  BUN 26* 24* 29*  CREATININE 2.19* 1.99* 2.00*  CALCIUM 8.3* 8.3* 8.6*  GLUCOSE 240* 216* 194*   Imaging/Diagnostic Tests: No results found.   MRI thoracic and lumbar spine 07/24/15: MRI thoracic/lumbar spine shows metastases throughout thoracic  verterbral bodies into left costovertebral junction with mass of L 7th rib and matastasis to R 7th rib, as well as tumor into prevertebral space at C6-C7, R iliac metastasis, and mass at L1-L2 slightly larger (12x8x20 mm vs 11x14x16 mm in Jan 2012) with effacement of thecal sac.   Rogue Bussing, MD 07/28/2015, 9:51 AM PGY-1, [place] Intern pager: [phone], text pages welcome

## 2015-07-29 LAB — GLUCOSE, CAPILLARY
Glucose-Capillary: 296 mg/dL — ABNORMAL HIGH (ref 65–99)
Glucose-Capillary: 200 mg/dL — ABNORMAL HIGH (ref 65–99)
Glucose-Capillary: 148 mg/dL — ABNORMAL HIGH (ref 65–99)

## 2015-07-29 LAB — CBC
HEMATOCRIT: 40.3 % (ref 39.0–52.0)
Hemoglobin: 13 g/dL (ref 13.0–17.0)
MCH: 29.2 pg (ref 26.0–34.0)
MCHC: 32.3 g/dL (ref 30.0–36.0)
MCV: 90.6 fL (ref 78.0–100.0)
Platelets: 212 10*3/uL (ref 150–400)
RBC: 4.45 MIL/uL (ref 4.22–5.81)
RDW: 13.8 % (ref 11.5–15.5)
WBC: 15.9 10*3/uL — AB (ref 4.0–10.5)

## 2015-07-29 LAB — BASIC METABOLIC PANEL
ANION GAP: 9 (ref 5–15)
BUN: 40 mg/dL — ABNORMAL HIGH (ref 6–20)
CALCIUM: 8.3 mg/dL — AB (ref 8.9–10.3)
CO2: 18 mmol/L — AB (ref 22–32)
Chloride: 117 mmol/L — ABNORMAL HIGH (ref 101–111)
Creatinine, Ser: 1.92 mg/dL — ABNORMAL HIGH (ref 0.61–1.24)
GFR, EST AFRICAN AMERICAN: 33 mL/min — AB (ref >60)
GFR, EST NON AFRICAN AMERICAN: 29 mL/min — AB (ref >60)
Glucose, Bld: 276 mg/dL — ABNORMAL HIGH (ref 65–99)
Potassium: 3.5 mmol/L (ref 3.5–5.1)
SODIUM: 144 mmol/L (ref 135–145)

## 2015-07-29 LAB — TSH: TSH: 0.715 u[IU]/mL (ref 0.350–4.500)

## 2015-07-29 MED ORDER — INSULIN ASPART 100 UNIT/ML ~~LOC~~ SOLN
0.0000 [IU] | Freq: Three times a day (TID) | SUBCUTANEOUS | Status: DC
  Administered 2015-07-29: 8 [IU] via SUBCUTANEOUS
  Administered 2015-07-29: 3 [IU] via SUBCUTANEOUS
  Administered 2015-07-30 (×3): 5 [IU] via SUBCUTANEOUS
  Administered 2015-07-31 (×2): 3 [IU] via SUBCUTANEOUS
  Administered 2015-07-31: 5 [IU] via SUBCUTANEOUS

## 2015-07-29 MED ORDER — INSULIN ASPART 100 UNIT/ML ~~LOC~~ SOLN: 0.0000 [IU] | Freq: Every day | SUBCUTANEOUS | Status: DC

## 2015-07-29 MED ORDER — HYDROCODONE-ACETAMINOPHEN 5-325 MG PO TABS
0.5000 | ORAL_TABLET | Freq: Four times a day (QID) | ORAL | Status: DC | PRN
  Administered 2015-07-30: 0.5 via ORAL
  Filled 2015-07-29: qty 1

## 2015-07-29 NOTE — Plan of Care (Signed)
Problem: Education: Goal: Knowledge of [facility] Education information/materials will improve Outcome: Not Applicable Date Met:  97/41/63 Patient only alert to self

## 2015-07-29 NOTE — Evaluation (Signed)
Physical Therapy Evaluation Patient Details Name: James Small MRN: [id number] DOB: [birth year] Today's Date: 07/29/2015   History of Present Illness  82 year old male admitted following a mechanical fall at home. Patient with intense upper lumbar pain without obvious radiation. 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 Patient declined surgery at that time. Patient also with significant long-standing lumbar stenosis at L4-5 and L5-S1. Patient chronically limited by back pain with some lower extremity symptoms. Patient with chronic urinary incontinence status post placement of suprapubic catheter. Patient has been suffering with severe back pain and spasms. No new obvious complaints of sensory loss or weakness. Workup also demonstrates metastatic disease extensively through multiple vertebral bodies and paravertebral in his thoracic spine and L1 vertebral body. Per NSU, some degree of an acute left-sided pathologic/minimal compression fracture of L1  Clinical Impression  Pt admitted with above diagnosis. Pt currently with functional limitations due to the deficits listed below (see PT Problem List). At the time of PT eval, pt was able to achieve sitting EOB with +2 max assist, and almost constant hands-on assist to maintain sitting balance. Pt agreeable and was following commands 75% of the time. Pt will benefit from skilled PT to increase their independence and safety with mobility to allow discharge to the venue listed below.       Follow Up Recommendations SNF;Supervision/Assistance - 24 hour    Equipment Recommendations  None recommended by PT (TBD by next venue of care)    Recommendations for Other Services       Precautions / Restrictions Precautions Precautions: Back;Fall Precaution Booklet Issued: No Restrictions Weight Bearing Restrictions: No      Mobility  Bed Mobility Overal bed mobility:  Needs Assistance;+2 for physical assistance Bed Mobility: Rolling;Supine to Sit;Sit to Sidelying Rolling: Max assist   Supine to sit: Max assist;+2 for physical assistance;HOB elevated   Sit to sidelying: Max assist;+2 for physical assistance General bed mobility comments: Pt able to attempt following commands to reach for bed rails with hand-over-hand assist to guide UE's. Pt able to achieve minimal movement of LE's to advance towards EOB. +2 required for trunk elevation to full sitting, and bed pad used for scoot out to EOB.   Transfers                 General transfer comment: Unsafe to attempt at this time.   Ambulation/Gait                Stairs            Wheelchair Mobility    Modified Rankin (Stroke Patients Only)       Balance Overall balance assessment: History of Falls;Needs assistance Sitting-balance support: Feet supported;Bilateral upper extremity supported Sitting balance-Leahy Scale: Poor Sitting balance - Comments: Min guard to max assist required at times. Pt was able to sit with hands on his knees for a short time but noted strong posterior lean when attempting LE movement at the same time.  Postural control: Posterior lean                                   Pertinent Vitals/Pain Pain Assessment: Faces Faces Pain Scale: Hurts little more Pain Location: Back with position changes Pain Descriptors / Indicators: Aching Pain Intervention(s): Limited activity within patient's tolerance;Monitored during session;Repositioned    Home Living Family/patient expects to be discharged to::  Skilled nursing facility Living Arrangements: Alone                    Prior Function Level of Independence: Independent with assistive device(s) (Recently started using a RW)         Comments: Pt lived alone and family checked on him several times/day. Family assisted with running errands     Hand Dominance   Dominant Hand:  Right    Extremity/Trunk Assessment   Upper Extremity Assessment: Defer to OT evaluation           Lower Extremity Assessment: Generalized weakness      Cervical / Trunk Assessment: Kyphotic  Communication   Communication: HOH  Cognition Arousal/Alertness: Lethargic Behavior During Therapy: WFL for tasks assessed/performed Overall Cognitive Status: Impaired/Different from baseline Area of Impairment: Memory;Following commands;Safety/judgement;Problem solving;Awareness   Current Attention Level: Focused Memory: Decreased recall of precautions;Decreased short-term memory Following Commands: Follows one step commands inconsistently;Follows one step commands with increased time Safety/Judgement: Decreased awareness of safety;Decreased awareness of deficits Awareness: Intellectual Problem Solving: Slow processing;Decreased initiation;Difficulty sequencing;Requires verbal cues;Requires tactile cues      General Comments      Exercises        Assessment/Plan    PT Assessment Patient needs continued PT services  PT Diagnosis Difficulty walking;Generalized weakness   PT Problem List Decreased strength;Decreased range of motion;Decreased activity tolerance;Decreased balance;Decreased mobility;Decreased knowledge of use of DME;Decreased safety awareness;Decreased knowledge of precautions;Pain  PT Treatment Interventions DME instruction;Gait training;Stair training;Functional mobility training;Therapeutic activities;Therapeutic exercise;Neuromuscular re-education;Patient/family education   PT Goals (Current goals can be found in the Care Plan section) Acute Rehab PT Goals PT Goal Formulation: Patient unable to participate in goal setting Time For Goal Achievement: 08/12/15 Potential to Achieve Goals: Fair    Frequency Min 1X/week   Barriers to discharge        Co-evaluation               End of Session   Activity Tolerance: Patient limited by fatigue Patient  left: in bed;with bed alarm set;with call bell/phone within reach Nurse Communication: Mobility status;Need for lift equipment         Time: [phone] PT Time Calculation (min) (ACUTE ONLY): 23 min   Charges:   PT Evaluation $PT Eval Moderate Complexity: 1 Procedure PT Treatments $Therapeutic Activity: 8-22 mins   PT G Codes:        Rolinda Roan 08/26/2015, 12:03 PM   Rolinda Roan, PT, DPT Acute Rehabilitation Services Pager: [phone]

## 2015-07-29 NOTE — Progress Notes (Signed)
Family Medicine Teaching Service Daily Progress Note Intern Pager: [phone]  Patient name: James Small Medical record number: [id number] Date of birth: 05-04-1922 Age: 82 y.o. Gender: male  Primary Care Provider: Limmie Patricia, MD Consultants: Palliative Care, Neurosurgery Code Status: Full (confirmed upon admission)  Pt Overview and Major Events to Date:  6/12: Patient presents with worsening pain after fall  Assessment and Plan: James Small is a 82 y.o. male presenting with back pain x 1 day after fall. 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  # Back pain: MRI showed extensive spinal metastases of unknown origin, though likely prostate given elevated PSA (129.23). Reviewed by Neurosurgeon Dr. Annette Small who noted L1 compression fracture. No loss of bowel function.No saddle anesthesia. Majority of pain thought to be due to compression fracture. - Consulted Palliative Care for assistance with pain management and further goals of care discussion.  - Consulted Neurosurgery. Hope for improvement over days to weeks to come, no surgical intervention at this time. Recommended IV decadron 10 mg q6h. Has lumbar corset. 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 Annette Small does not recommend surgical excision given patient's age, functional status, and previously wishes not to pursue surgery - Ordered lidocaine patch for non-sedating, local pain control - Fentanyl 12.5 mcg q2h prn for pain, dose decreased from initial 53m due to somnolence  - Lyrica 277mdaily - Start Norco 5-325 half tablet every 4 hours as needed - Baclofen switched from 5 mg TID scheduled to prn - Will avoid NSAID use at this time due to renal function. - PT/OT consult pending - Prn haldol for agitation (received 2 doses  overnight)  # Delirium with agitation: Improving. Contributing factors include pain, pain medications, hospitalization in elderly patient. - Prn IV haldol 2 mg q6h prn  - Encouraged family to speak with him and try to orient him - Keep blinds open during day  #HTN with tachycardia: Persistently tachycardic with vital checks.  - Restarted home atenolol 25 mg daily  - Vitals q2h with pulse oximetry - TSH pending  # AKI in setting of CKD: Resolved. Baseline SCr ~ 2.0 (according to labs with SCr 2.07 from 06/26/15).  - Avoid nephrotoxic medications, including NSAIDs - KVO fluids since taking PO - AM BMPs  # Hyperglycemia: Patient with history of pre-diabetes. Giving NS and decadron.  - Switched to NS for fluids 6/15 - SSI - CBGS qAC and qHS  # Leukocytosis: Increased in setting of steroid administration. Remains afebrile. UA with leukocytosis and micro with bacteria in setting of indwelling cath. Suspect chronic colonization. James Small Criteria not met, with patient afebrile without rigors or hypotension, no changes in mental status prior to receiving pain medications, and no purulent discharge.  - Continue to monitor daily CBCs.  - Suprapubic cath replaced 6/15 - Antibiotics not indicated, consider if clinically changes  # BPH and neurogenic bladder: Has indwelling catheter.  - Flush catheter three times weekly  # CAD s/p CABG in 1996: Follows with Dr. HoPercival SpanishSeen 07/18/15 and no medication changes suggested. Myoview from 04/2013 showed no high risk findings.  - Continue home aspirin 81 mg BID - Continue home atorvastatin 10 mg QHS  # Hypothyroidism: - Home synthroid 15032mdaily  # Poor appetite: - Continue home megace 3 mL BID when eating  FEN/GI: Dysphagia 1 diet,  NS KVO, oral care  Prophylaxis: Lovenox (Renally Dosed)  Disposition: Admitted for pain management and placement evaluation.   Subjective:  Patient oriented to self this morning. Knew that he was in a  hospital. Says that the pain is maybe a little better. No there new complaints this morning.   Objective: Temp:  [97.6 F (36.4 C)-99.6 F (37.6 C)] 97.6 F (36.4 C) (06/17 0515) Pulse Rate:  [110-123] 110 (06/17 0515) Resp:  [22-32] 22 (06/17 0515) BP: (157-165)/(97-99) 157/97 mmHg (06/17 0515) SpO2:  [94 %] 94 % (06/17 0515) Weight:  [181 lb 9.6 oz (82.373 kg)] 181 lb 9.6 oz (82.373 kg) (06/17 0700) Physical Exam: General: Elderly male, lying in bed with eyes open, resting comfortably  Cardiovascular: Tachycardic, S1, S2, no m/r/g Respiratory: CTAB, no increased WOB Abdomen: +BS, S, ND, NT, suprapubic catheter in place MSK: Normal tone. Moving all extremities spontaneously.  Skin: 3 cm healing laceration over right elbow Neuro: Awake. Somewhat slurred speech.  Psych: Still confused but conversant today and could express his needs.    Laboratory:  Recent Labs Lab 07/27/15 0428 07/28/15 0640 07/29/15 0330  WBC 19.3* 18.3* 15.9*  HGB 12.5* 13.2 13.0  HCT 38.2* 39.2 40.3  PLT 217 266 212    Recent Labs Lab 07/27/15 0428 07/28/15 0640 07/29/15 0330  NA 141 145 144  K 3.9 4.7 3.5  CL 115* 112* 117*  CO2 15* 18* 18*  BUN 24* 29* 40*  CREATININE 1.99* 2.00* 1.92*  CALCIUM 8.3* 8.6* 8.3*  GLUCOSE 216* 194* 276*   Imaging/Diagnostic Tests:  MRI thoracic and lumbar spine 07/24/15: MRI thoracic/lumbar spine shows metastases throughout thoracic verterbral bodies into left costovertebral junction with mass of L 7th rib and matastasis to R 7th rib, as well as tumor into prevertebral space at C6-C7, R iliac metastasis, and mass at L1-L2 slightly larger (12x8x20 mm vs 11x14x16 mm in Jan 2012) with effacement of thecal sac.   Vivi Barrack, MD 07/29/2015, 9:03 AM PGY-2, [place] Intern pager: [phone], text pages welcome

## 2015-07-30 LAB — BASIC METABOLIC PANEL
Anion gap: 10 (ref 5–15)
BUN: 49 mg/dL — ABNORMAL HIGH (ref 6–20)
CALCIUM: 8.2 mg/dL — AB (ref 8.9–10.3)
CO2: 19 mmol/L — AB (ref 22–32)
CREATININE: 1.92 mg/dL — AB (ref 0.61–1.24)
Chloride: 115 mmol/L — ABNORMAL HIGH (ref 101–111)
GFR calc non Af Amer: 29 mL/min — ABNORMAL LOW (ref >60)
GFR, EST AFRICAN AMERICAN: 33 mL/min — AB (ref >60)
Glucose, Bld: 208 mg/dL — ABNORMAL HIGH (ref 65–99)
Potassium: 3.6 mmol/L (ref 3.5–5.1)
Sodium: 144 mmol/L (ref 135–145)

## 2015-07-30 LAB — CBC
HEMATOCRIT: 41.3 % (ref 39.0–52.0)
Hemoglobin: 13.6 g/dL (ref 13.0–17.0)
MCH: 29.6 pg (ref 26.0–34.0)
MCHC: 32.9 g/dL (ref 30.0–36.0)
MCV: 90 fL (ref 78.0–100.0)
Platelets: 215 10*3/uL (ref 150–400)
RBC: 4.59 MIL/uL (ref 4.22–5.81)
RDW: 13.7 % (ref 11.5–15.5)
WBC: 14.8 10*3/uL — ABNORMAL HIGH (ref 4.0–10.5)

## 2015-07-30 LAB — GLUCOSE, CAPILLARY
GLUCOSE-CAPILLARY: 148 mg/dL — AB (ref 65–99)
Glucose-Capillary: 201 mg/dL — ABNORMAL HIGH (ref 65–99)
Glucose-Capillary: 228 mg/dL — ABNORMAL HIGH (ref 65–99)
Glucose-Capillary: 213 mg/dL — ABNORMAL HIGH (ref 65–99)

## 2015-07-30 MED ORDER — HALOPERIDOL LACTATE 5 MG/ML IJ SOLN: 0.5000 mg | Freq: Four times a day (QID) | INTRAMUSCULAR | Status: DC | PRN

## 2015-07-30 MED ORDER — PREDNISONE 50 MG PO TABS
50.0000 mg | ORAL_TABLET | Freq: Two times a day (BID) | ORAL | Status: DC
  Administered 2015-07-30 – 2015-08-01 (×4): 50 mg via ORAL
  Filled 2015-07-30 (×4): qty 1

## 2015-07-30 NOTE — Discharge Summary (Signed)
[facility] Discharge Summary  Patient name: James Small Medical record number: [id number] Date of birth: Jan 09, 1923 Age: 82 y.o. Gender: male Date of Admission: 07/24/2015  Date of Discharge: 08/01/2015 Admitting Physician: Kinnie Feil, MD  Primary Care Provider: Limmie Patricia, MD Consultants: Palliative Care, Neurosurgery  Indication for Hospitalization: pain after fall  Discharge Diagnoses/Problem List:  Active Problems:   Back injury   Fall   High risk social situation (lives alone)   Bone metastases    Bone mass   Intradural mass    Acute delirium   CKD (chronic kidney disease), stage III  Disposition: SNF  Discharge Condition: Improved  Discharge Exam:  Blood pressure 131/64, pulse 77, temperature 98.4 F (36.9 C), temperature source Oral, resp. rate 17, height '5\' 8"'$  (1.727 m), weight 181 lb 9.6 oz (82.373 kg), SpO2 97 %. Physical Exam: General: Elderly male, sitting back in bed Cardiovascular: RRR, S1, S2, no m/r/g Respiratory: CTAB, no increased WOB Abdomen: +BS, S, ND, NT, suprapubic catheter in place MSK: Normal tone. Moving all extremities spontaneously. Lifted legs on command with 5-/5 strength bilaterally. No TTP over thoracic/lumbar spine.  Skin: 3 cm healing laceration over right elbow Neuro: AOx2.5 (did not know year). Awake. Speech still slightly slurred but improved. Occasionally tangential.  Psych: Normal mood and affect.   Brief Hospital Course:  James Small is a 82 y.o. male who presented with back pain after a fall. PMH is significant for chronic back pain 2/2 degenerative disc disease and L5-S1 compression, CAD s/p CABG, BPH and neurogenic bladder with indwelling suprapubic catheter, hypothyroidism, and posterior intrathecal mass at L2.  Back pain:  Patient admitted for severe middle back pain after fall on 07/24/15. Xray lumbar and thoracic spine showed changes consistent with ankylosing spondylitis  (Denies previous history), as well as diffuse osteopenia and degenerative changes of thoracic spine. He has no radiculopathy, loss of bowel function, or saddle anesthesia. 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 MRI reviewed by Neurosurgeon Dr. Annette Stable who noted L1 compression fracture, which is thought be main cause of acute pain after fall, and may expect to see improvement in mobility over next few weeks. 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 Annette Stable does not recommend surgical excision given patient's age, functional status, and previously wishes not to pursue surgery. Neurosurgery recommended IV decadron 10 mg q6h for pain, which was continued for 5.5 days then switched to PO prednisone 50 mg BID on 6/18 once pain had improved. Plan to decrease dose in half every 5 days, such that he will need to start prednisone 25 mg BID 08/04/15. Neurosurgery also recommended lumbar corset be used while patient is ambulating, which was delivered to patient's room on 6/19. Palliative care was consulted for recommendations on pain management and to facilitate goals of care discussion. They recommended adding lyrica and continuing lidocaine patch, which was started by primary team. Initially patient was requiring IV fentanyl to control pain. At time of discharge, pain was controlled on oral medications of scheduled tylenol, ibuprofen (has renal dysfunction but also with diffuse mets), daily lyrica, and prn baclofen--in addition to lidocaine patch. Patient was discharged on a PPI due to scheduled steroids and NSAIDs to prevent gastric ulcer. PT/OT recommended SNF for mobility limitations and 24-hr assistance needs. At time of discharge, patient was requiring full assistance and having pain with ambulation.   Metastatic Cancer: Palliative Care was consulted, and family does not want to  pursue work-up. PSA was obtained and was elevated at 129.23. Family wants patient to remain Full Code,  as these were patient's wishes stated when he was coherent on admission. Continue to readdress as patient's mental status improves.   Delirium:  Resolved with reduced pain medication needs and discontinuation of haldol for agitation. Contributing factors include pain, pain medications, hospitalization in elderly patient. Patient still with residual confusion. Continue to re-orient. He has poor hearing and wears glasses.   HTN:  Well-controlled on home atenolol 25 mg daily, which was continued.   AKI in setting of CKD:  Resolved. 1.83 << 2.26. Baseline SCr ~ 2.0 (according to labs with SCr 2.07 from 06/26/15). Daughter says renal function has been stable with indwelling suprapubic catheter. May have been due to dehydration from decreased PO intake secondary to pain, but initial BUN:Cr ratio < 20.  Hyperglycemia: Had some CBGs in the 200s in setting of steroid administration. Did not continue CBG checks, as steroid dose will continue to be tapered down.   Leukocytosis:  Increased WBC in setting of steroid administration. He remained afebrile throughout hospitalization. UA with leukocytosis and micro with bacteria in setting of indwelling cath. Suspect chronic colonization. McGeer's Criteria not met, with patient afebrile without rigors or hypotension, no changes in mental status prior to receiving pain medications, and no purulent discharge. Antibiotics were not given.   BPH and neurogenic bladder:  Not medically managed. Has indwelling catheter. Flush catheter three times weekly. Suprapubic catheter replaced 07/27/15. Requires changing monthly.   CAD s/p CABG in 1996:  Follows with Dr. Percival Spanish. Seen 07/18/15 and no medication changes suggested. Myoview from 04/2013 showed no high risk findings. Continued home aspirin 81 mg BID and atorvastatin 10 mg QHS. May decide to discontinue these if family decides to pursue palliative plan of care.   Hypothyroidism: TSH 0.715 on 07/29/2015. Home synthroid  150 mcg continued.  Poor appetite:  Patient coughing with soft diet. Speech language pathologist evaluated and recommended liquid diet and re-evaluation. Family prefers to continue home megace 20m BID, though dose likely is too low to have much effect on appetite and is prothrombotic. Discussed with family to consider advancing diet if comfort measures preferred.  Dysphagia: Continue nectar-thick liquid diet. Will need ongoing speech evaluations. Nutrition recommended [employer]   Issues for Follow Up:  1. Continue steroid taper. 2. Consider remeron instead of megace for help with appetite.  3. Will need suprapubic catheter changed 08/26/2015. Flush catheter 3 times weekly.  4. Continue to address code status with family and patient, given low likelihood of positive outcome from CPR due to spinal metastases and age.  5. Will require continued speech evaluations, as discharged on liquid diet.  6. If family ultimately decides on palliative plan of care, discontinue preventive medications like aspirin and atorvastatin.  7. Patient was not discharged on DVT prophylaxis. If mobility does not improve, may need to consider starting.  Significant Procedures: None  Significant Labs and Imaging:   Recent Labs Lab 07/28/15 0640 07/29/15 0330 07/30/15 0216  WBC 18.3* 15.9* 14.8*  HGB 13.2 13.0 13.6  HCT 39.2 40.3 41.3  PLT 266 212 215    Recent Labs Lab 07/26/15 0337 07/27/15 0428 07/28/15 0640 07/29/15 0330 07/30/15 0216  NA 140 141 145 144 144  K 3.8 3.9 4.7 3.5 3.6  CL 113* 115* 112* 117* 115*  CO2 15* 15* 18* 18* 19*  GLUCOSE 240* 216* 194* 276* 208*  BUN 26* 24* 29* 40*  49*  CREATININE 2.19* 1.99* 2.00* 1.92* 1.92*  CALCIUM 8.3* 8.3* 8.6* 8.3* 8.2*   Dg Thoracic and Lumbar Spine 2 View  07/24/2015 IMPRESSION: 1. Diffuse osteopenia and degenerative change. Changes of ankylosing spondylitis noted. 2. No acute abnormality .   Mr Thoracic and Lumbar Spine Wo  Contrast  07/24/2015 IMPRESSION L1 metastasis. No pathologic fracture nor acute osseous process. No malalignment. 12 x 8 x 20 mm intradural mass at L1-2 appears slightly larger, corresponding to known tumor. This would be better characterized on postcontrast imaging. Tumor results in severe thecal sac effacement at L1-2. Mild osseous canal stenosis L5-S1. Moderate to severe LEFT L5-S1 neural foraminal narrowing.   Results/Tests Pending at Time of Discharge: None  Discharge Medications:    Medication List    ASK your doctor about these medications        aspirin EC 81 MG tablet  Take 81 mg by mouth 2 (two) times daily.     atenolol 50 MG tablet  Commonly known as:  TENORMIN  TAKE 1/2 TABLET BY MOUTH ONCE DAILY     atorvastatin 10 MG tablet  Commonly known as:  LIPITOR  Take 10 mg by mouth at bedtime.     cetirizine 10 MG tablet  Commonly known as:  ZYRTEC  Take 10 mg by mouth daily.     ibuprofen 200 MG tablet  Commonly known as:  ADVIL,MOTRIN  Take 800 mg by mouth every 6 (six) hours as needed for mild pain.     levothyroxine 150 MCG tablet  Commonly known as:  SYNTHROID, LEVOTHROID  Take 150 mcg by mouth daily before breakfast.     megestrol 400 MG/10ML suspension  Commonly known as:  MEGACE  Take by mouth 2 (two) times daily.     MULTI COMPLETE PO  Take 1 tablet by mouth daily.        Discharge Instructions: Please refer to Patient Instructions section of EMR for full details.  Patient was counseled important signs and symptoms that should prompt return to medical care, changes in medications, dietary instructions, activity restrictions, and follow up appointments.   Follow-Up Appointments: SNF  Rogue Bussing, MD 07/30/2015, 5:11 PM PGY-1, [place]

## 2015-07-30 NOTE — Clinical Social Work Note (Signed)
Clinical Social Work Assessment  Patient Details  Name: James Small MRN: [id number] Date of Birth: August 21, 1922  Date of referral:  07/30/15               Reason for consult:  Discharge Planning                Permission sought to share information with:  Case Manager, Facility Sport and exercise psychologist, Family Supports Permission granted to share information::  No  Name::        Agency::   (SNF)  Relationship::     Contact Information:     Housing/Transportation Living arrangements for the past 2 months:  Single Family Home Source of Information:  Medical Team, Adult Children Patient Interpreter Needed:  None Criminal Activity/Legal Involvement Pertinent to Current Situation/Hospitalization:  No - Comment as needed Significant Relationships:  Adult Children Lives with:  Self Do you feel safe going back to the place where you live?  No Need for family participation in patient care:  Yes (Comment)  Care giving concerns:  Pt confused unable to participate in assessment. CSW spoke with dtr Collie Siad who stated pt wants to discharge home. Pt daughter also open to SNF for higher level of care.    Social Worker assessment / plan: Holiday representative met with pt to discuss CSW role with discharge planning. Pt confused. CSW spoke with pt dtr Collie Siad via phone regarding CSW role in discharge planning. Pt dtr stated her father was admitted from home and that he wants to go home with home health if possible. CSW informed dtr of PT recommendation for SNF for higher level of care. Dtr stated that she is open to SNF and would like for her father to go to SNF close to family. Dtr stated that she would like father to go to [facility], [facility] [facility], or [facility]. Dtr also stated she will speak with her brother to assist in choosing SNF.   CSW will follow up with bed offers once available.   Employment status:  Retired Forensic scientist:  Information systems manager, Other (Comment Required) ([place] ) PT  Recommendations:  [place] / Referral to community resources:  [place]  Patient/Family's Response to care: Pt daughter understands SNF recommendation and appears happy with care her father is receiving at [facility].   Patient/Family's Understanding of and Emotional Response to Diagnosis, Current Treatment, and Prognosis: Pt dtr appears to have a good understanding of PT recommendation for SNF and pt care plan.   Emotional Assessment Appearance:  Appears stated age Attitude/Demeanor/Rapport:  Unable to Assess Affect (typically observed):  Unable to Assess Orientation:  Oriented to Self Alcohol / Substance use:  Not Applicable Psych involvement (Current and /or in the community):  No (Comment)  Discharge Needs  Concerns to be addressed:  Discharge Planning Concerns Readmission within the last 30 days:  No Current discharge risk:  Lives alone Barriers to Discharge:  Continued Medical Work up   Junie Spencer, LCSW 07/30/2015, 11:35 AM

## 2015-07-30 NOTE — NC FL2 (Signed)
[place] LEVEL OF CARE SCREENING TOOL     IDENTIFICATION  Patient Name: James Small Birthdate: 05-11-1922 Sex: male Admission Date (Current Location): 07/24/2015  [facility] and [place] Number:  Herbalist and Address:  The [place]. [facility], [place] [address], [place], [place] [address]      Provider Number: [id number]  Attending Physician Name and Address:  Zenia Resides, MD  Relative Name and Phone Number:       Current Level of Care: Hospital Recommended Level of Care: [place] Prior Approval Number:    Date Approved/Denied:   PASRR Number:   [id number]   Discharge Plan: SNF    Current Diagnoses: Patient Active Problem List   Diagnosis Date Noted  . CKD (chronic kidney disease), stage III   . Acute delirium   . Palliative care encounter   . Goals of care, counseling/discussion   . Bone metastases ([place])   . Bone mass   . Intradural mass ([place])   . Back injury 07/24/2015  . Back pain 07/24/2015  . Back injuries   . Fall   . Renal insufficiency   . High risk social situation   . ESSENTIAL HYPERTENSION, BENIGN 12/06/2008  . HYPOTHYROIDISM 12/01/2008  . DYSLIPIDEMIA 12/01/2008  . Coronary atherosclerosis 12/01/2008    Orientation RESPIRATION BLADDER Height & Weight     Self  Normal Continent Weight: 181 lb 9.6 oz (82.373 kg) Height:  5\' 8"  (172.7 cm)  BEHAVIORAL SYMPTOMS/MOOD NEUROLOGICAL BOWEL NUTRITION STATUS   (None )  (None) Continent Diet (DYS1)  AMBULATORY STATUS COMMUNICATION OF NEEDS Skin   Extensive Assist Verbally Bruising                       Personal Care Assistance Level of Assistance  Bathing, Feeding, Dressing Bathing Assistance: Limited assistance Feeding assistance: Independent Dressing Assistance: Limited assistance     Functional Limitations Info  Sight, Hearing, Speech Sight Info: Adequate Hearing Info: Adequate Speech Info: Adequate    SPECIAL CARE FACTORS  FREQUENCY  PT (By licensed PT)     PT Frequency:  (5X/WEEK)              Contractures Contractures Info: Not present    Additional Factors Info  Code Status, Allergies Code Status Info:  (FULL) Allergies Info:  (NKDA)           Current Medications (07/30/2015):  This is the current hospital active medication list Current Facility-Administered Medications  Medication Dose Route Frequency Provider Last Rate Last Dose  . 0.9 %  sodium chloride infusion   Intravenous Continuous Vivi Barrack, MD 10 mL/hr at 07/29/15 [phone]    . acetaminophen (TYLENOL) tablet 650 mg  650 mg Oral Q6H PRN Rogue Bussing, MD       Or  . acetaminophen (TYLENOL) suppository 650 mg  650 mg Rectal Q6H PRN Rogue Bussing, MD      . aspirin EC tablet 81 mg  81 mg Oral BID Rogue Bussing, MD   81 mg at 07/30/15 1050  . atenolol (TENORMIN) tablet 25 mg  25 mg Oral Daily Rogue Bussing, MD   25 mg at 07/30/15 1050  . atorvastatin (LIPITOR) tablet 10 mg  10 mg Oral QHS Rogue Bussing, MD   10 mg at 07/29/15 2128  . baclofen (LIORESAL) tablet 5 mg  5 mg Oral TID PRN Rogue Bussing, MD   5 mg at  07/25/15 2126  . dexamethasone (DECADRON) injection 10 mg  10 mg Intravenous Q6H Hillary Corinda Gubler, MD   10 mg at 07/30/15 1059  . enoxaparin (LOVENOX) injection 30 mg  30 mg Subcutaneous Q24H Rogue Bussing, MD   30 mg at 07/29/15 1800  . fentaNYL (SUBLIMAZE) injection 12.5 mcg  12.5 mcg Intravenous Q2H PRN Rogue Bussing, MD   12.5 mcg at 07/28/15 2137  . haloperidol lactate (HALDOL) injection 2 mg  2 mg Intravenous Q6H PRN Rogue Bussing, MD   2 mg at 07/30/15 1100  . HYDROcodone-acetaminophen (NORCO/VICODIN) 5-325 MG per tablet 0.5 tablet  0.5 tablet Oral Q6H PRN Vivi Barrack, MD      . insulin aspart (novoLOG) injection 0-15 Units  0-15 Units Subcutaneous TID WC Vivi Barrack, MD   5 Units at 07/30/15 0830  . insulin aspart  (novoLOG) injection 0-5 Units  0-5 Units Subcutaneous QHS Vivi Barrack, MD   0 Units at 07/29/15 2245  . labetalol (NORMODYNE,TRANDATE) injection 20 mg  20 mg Intravenous Q2H PRN Rogue Bussing, MD      . levothyroxine (SYNTHROID, LEVOTHROID) tablet 150 mcg  150 mcg Oral QAC breakfast Rogue Bussing, MD   150 mcg at 07/30/15 0829  . lidocaine (LIDODERM) 5 % 1 patch  1 patch Transdermal Q24H Sela Hua, MD   1 patch at 07/30/15 1051  . loratadine (CLARITIN) tablet 10 mg  10 mg Oral Daily Rogue Bussing, MD   10 mg at 07/30/15 1050  . megestrol (MEGACE) 400 MG/10ML suspension 120 mg  120 mg Oral BID Rogue Bussing, MD   120 mg at 07/30/15 1050  . pregabalin (LYRICA) capsule 25 mg  25 mg Oral Daily Pershing Proud, NP   25 mg at 07/30/15 1050  . RESOURCE THICKENUP CLEAR   Oral PRN Zenia Resides, MD         Discharge Medications: Please see discharge summary for a list of discharge medications.  Relevant Imaging Results:  Relevant Lab Results:   Additional Information SS#[id number]  Junie Spencer, LCSW

## 2015-07-30 NOTE — Progress Notes (Signed)
Family Medicine Teaching Service Daily Progress Note Intern Pager: [phone]  Patient name: James Small Medical record number: [id number] Date of birth: 1922/06/19 Age: 82 y.o. Gender: male  Primary Care Provider: Limmie Patricia, MD Consultants: Palliative Care, Neurosurgery Code Status: Full (confirmed upon admission)  Pt Overview and Major Events to Date:  6/12: Patient presents with worsening pain after fall  Assessment and Plan: James Small is a 82 y.o. male presenting with back pain x 1 day after fall. 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  # Back pain: Denies pain today. 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 Reviewed by Neurosurgeon Dr. Annette Stable who noted L1 compression fracture. Xray lumbar and thoracic spine showed changes consistent with ankylosing spondylitis (Denies previous history). Diffuse osteopenia and degenerative changes of thoracic spine. No radiculopathy on initial exam. No loss of bowel function.No saddle anesthesia. Majority of pain thought to be due to compression fracture. - Consulted Palliative Care for assistance with pain management and further goals of care discussion. Appreciate recommendations --> Added Lyrica for when patient is able to tolerate PO. Agreed with steroids. Confirmed with family they want patient to remain Full Code and that they are not interested in pursuing workup of metastatic disease at this time.   - Consulted Neurosurgery for advice on stabilization/possible treatment options to offer family --> Suspect L1 compression fracture is causing most of patient's acute pain. Hope for improvement over days to weeks to come. Recommended IV decadron 10 mg q6h. May need lumbar corset if starts to mobilize. 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 Annette Stable does not recommend surgical excision given patient's age, functional status, and previously wishes not to pursue surgery --> Contact Neurosurgery tomorrow, 6/19, to arrange corset in anticipation of discharge and ask for recommended duration of steroids - Will switch IV decadron to PO prednisone (1:5 conversion) at half current dosing to prednisone 50 mg BID, with first dose this evening. - Obtain PSA --> Elevated at 129.23.  - Ordered lidocaine patch for non-sedating, local pain control - Switch to 0.5 tablet of norco q6h prn for pain from fentanyl 12.5 mcg q2h prn for pain - Baclofen switched from 5 mg TID scheduled to prn (no doses yesterday) - Will avoid NSAID use at this time due to renal function. - PT/OT recommending SNF  # Delirium with agitation: Continues to improve; AOx3 but still somewhat confused. Contributing factors include pain, pain medications, hospitalization in elderly patient. - Decreased prn IV haldol to 0.5 mg q6h prn (from 2 mg) - Encouraged family to speak with him and try to orient him - Keep blinds open during day  #HTN: Tachycardia resolved. BPs improved to 140/81-166/89.  - Continue home atenolol 25 mg daily  - Vitals q4h with pulse oximetry  # AKI in setting of CKD: Resolved. 1.92 << 2.26. Baseline SCr ~ 2.0 (according to labs with SCr 2.07 from 06/26/15). Daughter says renal function has been stable with indwelling suprapubic cath. May be due to dehydration from decreased PO 2/2 pain, but initial BUN:Cr ratio < 20. - Avoid nephrotoxic medications, including NSAIDs - AM BMPs every other day  # Hyperglycemia: Patient with history of pre-diabetes. Giving decadron.  - Follow with glucose on BMP --> glucose 208 this am, 6/18 - SSI ordered. CBGs qAC/HS.   # Leukocytosis:  Increased in setting of steroid administration. Remains afebrile. WBC 14.8 < 15.9 < 18.3 < 19.3 < 13.2 < 13.8 < 15.4 on admission, previously elevated to 10.9 with  last labs 06/26/15. UA with leukocytosis and micro with bacteria in setting of indwelling cath. Suspect chronic colonization. McGeer's Criteria not met, with patient afebrile without rigors or hypotension, no changes in mental status prior to receiving pain medications, and no purulent discharge.  - Continue to monitor CBCs every other day - Suprapubic cath replaced 6/15 - Antibiotics not indicated, consider if clinically changes  # BPH and neurogenic bladder: Not medically managed. Has indwelling catheter.  - Flush catheter three times weekly  # CAD s/p CABG in 1996: Follows with Dr. Percival Spanish. Seen 07/18/15 and no medication changes suggested. Myoview from 04/2013 showed no high risk findings.  - Continue home aspirin 81 mg BID - Continue home atorvastatin 10 mg QHS  # Hypothyroidism: - Home synthroid 150 mcg   # Poor appetite: Improved from yesterday. - Continue home megace 3 mL BID when eating - Consider repeat SLP assessment 6/19, as patient is more alert than on initial evaluation.   FEN/GI: Dysphagia 1 diet, oral care, SLIV  Prophylaxis: Lovenox (Renally Dosed)  Disposition: Admitted for pain management and placement evaluation. Anticipate discharge to SNF in next 1-2 days.   Subjective:  Patient fully conversant this morning. States he is very thirsty. Let him know about recommendation of SNF, which he did not oppose, but did not seem to fully understand. He [place] and was wondering why they weren't picking him up.   Objective: Temp:  [97.6 F (36.4 C)-97.9 F (36.6 C)] 97.6 F (36.4 C) (06/18 0500) Pulse Rate:  [76-99] 76 (06/18 0500) Resp:  [19-20] 19 (06/18 0500) BP: (140-166)/(81-91) 166/89 mmHg (06/18 0500) SpO2:  [92 %-97 %] 92 % (06/18 0500) Physical Exam: General: Elderly male, sitting up in bed, watching TV Cardiovascular: RRR, S1, S2, no m/r/g Respiratory: CTAB, no increased WOB Abdomen: +BS, S, ND, NT,  suprapubic catheter in place MSK: Normal tone. Moving all extremities spontaneously. Lifted legs on command with 5-/5 strength bilaterally.  Skin: 3 cm healing laceration over right elbow Neuro: AOx2 (did not know year). Awake. Speech still slightly slurred.   Psych: Tearful.   Laboratory:  Recent Labs Lab 07/28/15 0640 07/29/15 0330 07/30/15 0216  WBC 18.3* 15.9* 14.8*  HGB 13.2 13.0 13.6  HCT 39.2 40.3 41.3  PLT 266 212 215    Recent Labs Lab 07/28/15 0640 07/29/15 0330 07/30/15 0216  NA 145 144 144  K 4.7 3.5 3.6  CL 112* 117* 115*  CO2 18* 18* 19*  BUN 29* 40* 49*  CREATININE 2.00* 1.92* 1.92*  CALCIUM 8.6* 8.3* 8.2*  GLUCOSE 194* 276* 208*   Imaging/Diagnostic Tests: No results found.   MRI thoracic and lumbar spine 07/24/15: MRI thoracic/lumbar spine shows metastases throughout thoracic verterbral bodies into left costovertebral junction with mass of L 7th rib and matastasis to R 7th rib, as well as tumor into prevertebral space at C6-C7, R iliac metastasis, and mass at L1-L2 slightly larger (12x8x20 mm vs 11x14x16 mm in Jan 2012) with effacement of thecal sac.   Rogue Bussing, MD 07/30/2015, 8:41 AM PGY-1, [place] Intern pager: [phone], text pages welcome

## 2015-07-30 NOTE — NC FL2 (Signed)
[place] LEVEL OF CARE SCREENING TOOL     IDENTIFICATION  Patient Name: James Small Birthdate: [birth year] Sex: male Admission Date (Current Location): 07/24/2015  [facility] and [place] Number:  Herbalist and Address:  The [place]. [facility], [place] [address], [place], [place] [address]      Provider Number: [id number]  Attending Physician Name and Address:  Zenia Resides, MD  Relative Name and Phone Number:       Current Level of Care: Hospital Recommended Level of Care: [place] Prior Approval Number:    Date Approved/Denied:   PASRR Number:    Discharge Plan: SNF    Current Diagnoses: Patient Active Problem List   Diagnosis Date Noted  . CKD (chronic kidney disease), stage III   . Acute delirium   . Palliative care encounter   . Goals of care, counseling/discussion   . Bone metastases ([place])   . Bone mass   . 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 Back injury 07/24/2015  . Back pain 07/24/2015  . Back injuries   . Fall   . Renal insufficiency   . High risk social situation   . ESSENTIAL HYPERTENSION, BENIGN 12/06/2008  . HYPOTHYROIDISM 12/01/2008  . DYSLIPIDEMIA 12/01/2008  . Coronary atherosclerosis 12/01/2008    Orientation RESPIRATION BLADDER Height & Weight     Self  Normal Continent Weight: 181 lb 9.6 oz (82.373 kg) Height:  5\' 8"  (172.7 cm)  BEHAVIORAL SYMPTOMS/MOOD NEUROLOGICAL BOWEL NUTRITION STATUS   (None )  (None) Continent Diet (DYS1)  AMBULATORY STATUS COMMUNICATION OF NEEDS Skin   Extensive Assist Verbally Bruising                       Personal Care Assistance Level of Assistance  Bathing, Feeding, Dressing Bathing Assistance: Limited assistance Feeding assistance: Independent Dressing Assistance: Limited assistance     Functional Limitations Info  Sight, Hearing, Speech Sight Info: Adequate Hearing Info: Adequate Speech Info: Adequate    SPECIAL CARE FACTORS FREQUENCY  PT  (By licensed PT)     PT Frequency:  (5X/WEEK)              Contractures Contractures Info: Not present    Additional Factors Info  Code Status, Allergies Code Status Info:  (FULL) Allergies Info:  (NKDA)           Current Medications (07/30/2015):  This is the current hospital active medication list Current Facility-Administered Medications  Medication Dose Route Frequency Provider Last Rate Last Dose  . 0.9 %  sodium chloride infusion   Intravenous Continuous Vivi Barrack, MD 10 mL/hr at 07/29/15 [phone]    . acetaminophen (TYLENOL) tablet 650 mg  650 mg Oral Q6H PRN Rogue Bussing, MD       Or  . acetaminophen (TYLENOL) suppository 650 mg  650 mg Rectal Q6H PRN Rogue Bussing, MD      . aspirin EC tablet 81 mg  81 mg Oral BID Rogue Bussing, MD   81 mg at 07/30/15 1050  . atenolol (TENORMIN) tablet 25 mg  25 mg Oral Daily Rogue Bussing, MD   25 mg at 07/30/15 1050  . atorvastatin (LIPITOR) tablet 10 mg  10 mg Oral QHS Rogue Bussing, MD   10 mg at 07/29/15 2128  . baclofen (LIORESAL) tablet 5 mg  5 mg Oral TID PRN Rogue Bussing, MD   5 mg at 07/25/15 2126  .  enoxaparin (LOVENOX) injection 30 mg  30 mg Subcutaneous Q24H Rogue Bussing, MD   30 mg at 07/29/15 1800  . haloperidol lactate (HALDOL) injection 0.5 mg  0.5 mg Intravenous Q6H PRN Rogue Bussing, MD      . HYDROcodone-acetaminophen (NORCO/VICODIN) 5-325 MG per tablet 0.5 tablet  0.5 tablet Oral Q6H PRN Vivi Barrack, MD      . insulin aspart (novoLOG) injection 0-15 Units  0-15 Units Subcutaneous TID WC Vivi Barrack, MD   5 Units at 07/30/15 1200  . insulin aspart (novoLOG) injection 0-5 Units  0-5 Units Subcutaneous QHS Vivi Barrack, MD   0 Units at 07/29/15 2245  . levothyroxine (SYNTHROID, LEVOTHROID) tablet 150 mcg  150 mcg Oral QAC breakfast Rogue Bussing, MD   150 mcg at 07/30/15 0829  . lidocaine (LIDODERM) 5 % 1 patch  1 patch  Transdermal Q24H Sela Hua, MD   1 patch at 07/30/15 1051  . loratadine (CLARITIN) tablet 10 mg  10 mg Oral Daily Rogue Bussing, MD   10 mg at 07/30/15 1050  . megestrol (MEGACE) 400 MG/10ML suspension 120 mg  120 mg Oral BID Rogue Bussing, MD   120 mg at 07/30/15 1050  . predniSONE (DELTASONE) tablet 50 mg  50 mg Oral BID WC Rogue Bussing, MD      . pregabalin (LYRICA) capsule 25 mg  25 mg Oral Daily Pershing Proud, NP   25 mg at 07/30/15 1050  . RESOURCE THICKENUP CLEAR   Oral PRN Zenia Resides, MD         Discharge Medications: Please see discharge summary for a list of discharge medications.  Relevant Imaging Results:  Relevant Lab Results:   Additional Information [id number]  Junie Spencer, LCSW

## 2015-07-30 NOTE — Clinical Social Work Placement (Signed)
   CLINICAL SOCIAL WORK PLACEMENT  NOTE  Date:  07/30/2015  Patient Details  Name: James Small MRN: [id number] Date of Birth: 1922-10-21  Clinical Social Work is seeking post-discharge placement for this patient at the [place] level of care (*CSW will initial, date and re-position this form in  chart as items are completed):  Yes   Patient/family provided with [place] Work Department's list of facilities offering this level of care within the geographic area requested by the patient (or if unable, by the patient's family).  Yes   Patient/family informed of their freedom to choose among providers that offer the needed level of care, that participate in Medicare, Medicaid or managed care program needed by the patient, have an available bed and are willing to accept the patient.  Yes   Patient/family informed of [facility]'s ownership interest in [facility] and [facility], as well as of the fact that they are under no obligation to receive care at these facilities.  PASRR submitted to EDS on 07/30/15     PASRR number received on 07/30/15     Existing PASRR number confirmed on       FL2 transmitted to all facilities in geographic area requested by pt/family on 07/30/15     FL2 transmitted to all facilities within larger geographic area on 07/30/15     Patient informed that his/her managed care company has contracts with or will negotiate with certain facilities, including the following:            Patient/family informed of bed offers received.  Patient chooses bed at       Physician recommends and patient chooses bed at      Patient to be transferred to   on  .  Patient to be transferred to facility by       Patient family notified on   of transfer.  Name of family member notified:        PHYSICIAN Please prepare priority discharge summary, including medications, Please prepare prescriptions, Please sign FL2      Additional Comment:    _______________________________________________ Junie Spencer, LCSW 07/30/2015, 3:29 PM

## 2015-07-30 NOTE — Progress Notes (Signed)
Chart reviewed.  Patient resting in bed, is able to respond more appropriately No family at the bedside BP 166/89 mmHg  Pulse 76  Temp(Src) 97.6 F (36.4 C) (Axillary)  Resp 19  Ht 5\' 8"  (1.727 m)  Wt 82.373 kg (181 lb 9.6 oz)  BMI 27.62 kg/m2  SpO2 92% Labs reviewed CSW evaluating patient, discussing with patient's daughter.  Plan: Agree with SNF rehab on d/c Will recommend palliative care follow the patient at the SNF rehab.   Loistine Chance MD [facility] health palliative medicine team [phone]

## 2015-07-30 NOTE — Progress Notes (Signed)
Visited patient at bedside. Denied pain. He was watching TV and speaking coherently.

## 2015-07-31 LAB — CBC
HCT: 42.2 % (ref 39.0–52.0)
Hemoglobin: 13.9 g/dL (ref 13.0–17.0)
MCH: 30.2 pg (ref 26.0–34.0)
MCHC: 32.9 g/dL (ref 30.0–36.0)
MCV: 91.5 fL (ref 78.0–100.0)
PLATELETS: 206 10*3/uL (ref 150–400)
RBC: 4.61 MIL/uL (ref 4.22–5.81)
RDW: 13.8 % (ref 11.5–15.5)
WBC: 20.5 10*3/uL — ABNORMAL HIGH (ref 4.0–10.5)

## 2015-07-31 LAB — GLUCOSE, CAPILLARY
Glucose-Capillary: 197 mg/dL — ABNORMAL HIGH (ref 65–99)
Glucose-Capillary: 201 mg/dL — ABNORMAL HIGH (ref 65–99)
Glucose-Capillary: 182 mg/dL — ABNORMAL HIGH (ref 65–99)
Glucose-Capillary: 146 mg/dL — ABNORMAL HIGH (ref 65–99)

## 2015-07-31 LAB — BASIC METABOLIC PANEL
Anion gap: 8 (ref 5–15)
BUN: 53 mg/dL — ABNORMAL HIGH (ref 6–20)
CALCIUM: 8.1 mg/dL — AB (ref 8.9–10.3)
CO2: 21 mmol/L — AB (ref 22–32)
CREATININE: 2.04 mg/dL — AB (ref 0.61–1.24)
Chloride: 115 mmol/L — ABNORMAL HIGH (ref 101–111)
GFR calc non Af Amer: 27 mL/min — ABNORMAL LOW (ref >60)
GFR, EST AFRICAN AMERICAN: 31 mL/min — AB (ref >60)
GLUCOSE: 209 mg/dL — AB (ref 65–99)
Potassium: 3.4 mmol/L — ABNORMAL LOW (ref 3.5–5.1)
Sodium: 144 mmol/L (ref 135–145)

## 2015-07-31 MED ORDER — ACETAMINOPHEN 325 MG PO TABS
650.0000 mg | ORAL_TABLET | Freq: Four times a day (QID) | ORAL | Status: DC
  Administered 2015-07-31 – 2015-08-01 (×4): 650 mg via ORAL
  Filled 2015-07-31 (×5): qty 2

## 2015-07-31 MED ORDER — IBUPROFEN 200 MG PO TABS
200.0000 mg | ORAL_TABLET | Freq: Four times a day (QID) | ORAL | Status: DC
  Administered 2015-07-31 – 2015-08-01 (×4): 200 mg via ORAL
  Filled 2015-07-31 (×5): qty 1

## 2015-07-31 NOTE — Progress Notes (Signed)
Occupational Therapy Treatment Patient Details Name: James Small MRN: [id number] DOB: [birth year] Today's Date: 07/31/2015    History of present illness 82 year old male admitted following a mechanical fall at home. Patient with intense upper lumbar pain without obvious radiation. 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 Patient declined surgery at that time. Patient also with significant long-standing lumbar stenosis at L4-5 and L5-S1. Patient chronically limited by back pain with some lower extremity symptoms. Patient with chronic urinary incontinence status post placement of suprapubic catheter. Patient has been suffering with severe back pain and spasms. No new obvious complaints of sensory loss or weakness. Workup also demonstrates metastatic disease extensively through multiple vertebral bodies and paravertebral in his thoracic spine and L1 vertebral body. Per NSU, some degree of an acute left-sided pathologic/minimal compression fracture of L1   OT comments  Pt continues to requires max to total A with ADL. Needs full S with feeding due to poor safety awareness and risk for aspiration. Rolling in bed with max A. Pt conversant and following commands with increased time today. Assisted nsg with changing positions to relieve pressure from bottom. Pt will benefit from rehab at SNF.   Follow Up Recommendations  SNF;Supervision/Assistance - 24 hour    Equipment Recommendations  Other (comment) (TBA at SNF)    Recommendations for Other Services      Precautions / Restrictions Precautions Precautions: Back;Fall Precaution Booklet Issued: No Restrictions Weight Bearing Restrictions: No       Mobility Bed Mobility Overal bed mobility: Needs Assistance Bed Mobility: Rolling Rolling: Max assist   Supine to sit: Max assist;HOB elevated Sit to supine: Total assist   General bed mobility comments: Pt  moaning when HOB lowered. Educated on using LE to push when rolling and using arms to help pull on rails. Movements very slow.  Transfers                 General transfer comment: Unsafe to attempt at this time.     Balance       Sitting balance - Comments: unable to fully achieve upright sitting with lean to his Rt                           ADL Overall ADL's : Needs assistance/impaired Eating/Feeding: Minimal assistance Eating/Feeding Details (indicate cue type and reason): nectar liquids; requries close S Grooming: Moderate assistance   Upper Body Bathing: Maximal assistance   Lower Body Bathing: Total assistance   Upper Body Dressing : Total assistance   Lower Body Dressing: Total assistance                 General ADL Comments: Able to tolerate rolling side to side with use of bed rails      Vision                     Perception     Praxis      Cognition   Behavior During Therapy: [facility] for tasks assessed/performed Overall Cognitive Status: No family/caregiver present to determine baseline cognitive functioning Area of Impairment: Problem solving;Awareness;Orientation Orientation Level: Place;Time;Situation Current Attention Level: Sustained    Following Commands: Follows one step commands with increased time;Follows one step commands consistently Safety/Judgement: Decreased awareness of deficits   Problem Solving: Slow processing;Decreased initiation;Difficulty sequencing;Requires verbal cues;Requires tactile cues      Extremity/Trunk Assessment  Exercises General Exercises - Lower Extremity Ankle Circles/Pumps: AAROM;Both;5 reps Heel Slides: AAROM;Both;5 reps (Lt flexion to 45 only and then increases his pain) Hip ABduction/ADduction: AAROM;Right;Left;5 reps   Shoulder Instructions       General Comments      Pertinent Vitals/ Pain       Pain Assessment: Faces Faces Pain Scale: Hurts even  more Pain Location: back Pain Descriptors / Indicators: Grimacing;Moaning;Guarding Pain Intervention(s): Limited activity within patient's tolerance;Repositioned  Home Living                                          Prior Functioning/Environment              Frequency Min 2X/week     Progress Toward Goals  OT Goals(current goals can now be found in the care plan section)  Progress towards OT goals: Progressing toward goals  Acute Rehab OT Goals Patient Stated Goal: family is for pt to eventually return home OT Goal Formulation: With patient/family Time For Goal Achievement: 08/09/15 Potential to Achieve Goals: Fair ADL Goals Pt Will Perform Grooming: with set-up;with supervision;sitting Pt Will Perform Upper Body Bathing: with set-up;with supervision;sitting Pt Will Transfer to Toilet: with +2 assist;bedside commode;stand pivot transfer;with mod assist Additional ADL Goal #1: Pt will compmlete bed mobility for ADL wtih +2 Mod A  Plan Discharge plan remains appropriate    Co-evaluation                 End of Session     Activity Tolerance Patient limited by pain   Patient Left in bed;with call bell/phone within reach;with bed alarm set;with nursing/sitter in room   Nurse Communication Mobility status        Time: 1450-1509 OT Time Calculation (min): 19 min  Charges: OT General Charges $OT Visit: 1 Procedure OT Treatments $Self Care/Home Management : 8-22 mins  Georgios Kina,HILLARY 07/31/2015, 3:14 PM   [facility], OTR/L  [phone] 07/31/2015

## 2015-07-31 NOTE — Progress Notes (Signed)
Family Medicine Teaching Service Daily Progress Note Intern Pager: [phone]  Patient name: James Small Medical record number: [id number] Date of birth: 1922-04-15 Age: 82 y.o. Gender: male  Primary Care Provider: Limmie Patricia, MD Consultants: Palliative Care, Neurosurgery Code Status: Full (confirmed upon admission)  Pt Overview and Major Events to Date:  6/12: Patient presents with worsening pain after fall  Assessment and Plan: James Small is a 82 y.o. male presenting with back pain x 1 day after fall. 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  # Back pain: Denies pain today. 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 Reviewed by Neurosurgeon Dr. Annette Stable who noted L1 compression fracture. Xray lumbar and thoracic spine showed changes consistent with ankylosing spondylitis (Denies previous history). Diffuse osteopenia and degenerative changes of thoracic spine. No radiculopathy on initial exam. No loss of bowel function.No saddle anesthesia. Majority of pain thought to be due to compression fracture. - Consulted Palliative Care for assistance with pain management and further goals of care discussion. Appreciate recommendations --> Added Lyrica for when patient is able to tolerate PO. Agreed with steroids. Confirmed with family they want patient to remain Full Code and that they are not interested in pursuing workup of metastatic disease at this time.   - Consulted Neurosurgery for advice on stabilization/possible treatment options to offer family --> Suspect L1 compression fracture is causing most of patient's acute pain. Hope for improvement over days to weeks to come. Recommended IV decadron 10 mg q6h. May need lumbar corset if starts to mobilize. 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 Annette Stable does not recommend surgical excision given patient's age, functional status, and previously wishes not to pursue surgery --> Contacted Neurosurgery 6/19 and spoke with Dr. Saintclair Halsted who agreed with steroid taper, as patient's pain improved, and lumbar corset with activity - Switched IV decadron to PO prednisone (1:5 conversion) at half current dosing to prednisone 50 mg BID 6/18. Plan to taper down every 5 days. - Obtain PSA --> Elevated at 129.23.  - Ordered lidocaine patch for non-sedating, local pain control - Order scheduled tylenol and ibuprofen (has renal dysfunction but also with diffuse mets) - Discontinued opioid pain medication - Baclofen 5 mg TID scheduled to prn - PT/OT recommending SNF --> will contact family about disposition   # Delirium with agitation: Continues to improve; AOx2.5 (doesn't know year) but still somewhat confused. Contributing factors include pain, pain medications, hospitalization in elderly patient. - Discontinued haldol and opioid pain medication to reduce causes of confusion - Encouraged family to speak with him and try to orient him - Keep blinds open during day  #HTN: Tachycardia resolved. BPs improved to 150s/80s.  - Continue home atenolol 25 mg daily  - Vitals per unit protocol   # AKI in setting of CKD: Resolved. 1.92 << 2.26. Baseline SCr ~ 2.0 (according to labs with SCr 2.07 from 06/26/15). Daughter says renal function has been stable with indwelling suprapubic cath. May be due to dehydration from decreased PO 2/2 pain, but initial BUN:Cr ratio < 20. - AM BMPs every other day  # Hyperglycemia: Patient with history of pre-diabetes. Had been on decadron.  - Discontinue CBG checks and SSI, as have decreased steroid dose.  # Leukocytosis: Increased in setting of steroid administration. Remains afebrile. WBC 14.8 < 15.9 <  18.3 < 19.3 < 13.2 < 13.8 < 15.4 on admission, previously elevated to 10.9 with last labs 06/26/15. UA with  leukocytosis and micro with bacteria in setting of indwelling cath. Suspect chronic colonization. McGeer's Criteria not met, with patient afebrile without rigors or hypotension, no changes in mental status prior to receiving pain medications, and no purulent discharge.  - Continue to monitor CBCs every other day - Suprapubic cath replaced 6/15 - Antibiotics not indicated, consider if clinically changes  # BPH and neurogenic bladder: Not medically managed. Has indwelling catheter.  - Flush catheter three times weekly  # CAD s/p CABG in 1996: Follows with Dr. Percival Spanish. Seen 07/18/15 and no medication changes suggested. Myoview from 04/2013 showed no high risk findings.  - Continue home aspirin 81 mg BID - Continue home atorvastatin 10 mg QHS  # Hypothyroidism: - Home synthroid 150 mcg   # Poor appetite: Improved from yesterday. - Continue home megace 3 mL BID when eating - Repeat SLP assessment 6/19 concerning for aspiration of soft food - Nutrition consulted, appreciate recommendations  FEN/GI: Liquid diet, oral care, SLIV  Prophylaxis: Lovenox (Renally Dosed)  Disposition: Admitted for pain management and placement evaluation. Anticipate discharge to SNF in next 1-2 days.   Subjective:  Patient fully conversant this morning. He is eager to share stories about his family and denies back pain unless he tries to move.   Objective: Temp:  [97.6 F (36.4 C)-98.7 F (37.1 C)] 98.5 F (36.9 C) (06/19 0442) Pulse Rate:  [81-96] 91 (06/19 0442) BP: (114-165)/(66-90) 165/86 mmHg (06/19 0442) SpO2:  [95 %-97 %] 95 % (06/19 0442) Physical Exam: General: Elderly male, sitting back in bed Cardiovascular: RRR, S1, S2, no m/r/g Respiratory: CTAB, no increased WOB Abdomen: +BS, S, ND, NT, suprapubic catheter in place MSK: Normal tone. Moving all extremities spontaneously. Lifted legs on command with 5-/5 strength bilaterally.  Skin: 3 cm healing laceration over right elbow Neuro: AOx2 (did  not know year). Awake. Speech still slightly slurred but improved.  Psych: Normal mood and affect.   Laboratory:  Recent Labs Lab 07/28/15 0640 07/29/15 0330 07/30/15 0216  WBC 18.3* 15.9* 14.8*  HGB 13.2 13.0 13.6  HCT 39.2 40.3 41.3  PLT 266 212 215    Recent Labs Lab 07/28/15 0640 07/29/15 0330 07/30/15 0216  NA 145 144 144  K 4.7 3.5 3.6  CL 112* 117* 115*  CO2 18* 18* 19*  BUN 29* 40* 49*  CREATININE 2.00* 1.92* 1.92*  CALCIUM 8.6* 8.3* 8.2*  GLUCOSE 194* 276* 208*   Imaging/Diagnostic Tests: No results found.   MRI thoracic and lumbar spine 07/24/15: MRI thoracic/lumbar spine shows metastases throughout thoracic verterbral bodies into left costovertebral junction with mass of L 7th rib and matastasis to R 7th rib, as well as tumor into prevertebral space at C6-C7, R iliac metastasis, and mass at L1-L2 slightly larger (12x8x20 mm vs 11x14x16 mm in Jan 2012) with effacement of thecal sac.   Rogue Bussing, MD 07/31/2015, 9:58 AM PGY-1, [place] Intern pager: [phone], text pages welcome

## 2015-07-31 NOTE — Progress Notes (Signed)
Speech Language Pathology Treatment: Dysphagia  Patient Details Name: James Small MRN: [id number] DOB: 1922-11-08 Today's Date: 07/31/2015 Time: 0810-0901 SLP Time Calculation (min) (ACUTE ONLY): 51 min  Assessment / Plan / Recommendation Clinical Impression  Pt seen with am meal, pt alert and talkative, still a little disoriented to current events, but able to discuss the past and personal history at length and follow commands willingly. Pt tolerated nectar thick liquids with a delay in swallow response, and slight wet vocal quality but no signs of choking over 6 oz. When he was given grits/pureed eggs pt had a significant aspiration event with some suspected reflux/regurgitation. After coughing calmed SLP again gave liquids without difficulty and again triad two bites of puree texture, with same result. Pt was able to comprehend difficulty and said, "I think the liquids go down much better." Solid food dysphagia is concerning for an esophageal component. For now, pt should consume nectar thick liquids only. Will return tomorrow to trial solids again. Discussed with daughter and MD. Will consider Modified Barium Swallow if/when pt is able to tolerate positioning, possibly as an outpatient.    HPI HPI: 82 year old male admitted following a mechanical fall at home. 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 Patient chronically limited by back pain with some lower extremity symptoms. Patient with chronic urinary incontinence status post placement of suprapubic catheter. No new obvious complaints of sensory loss or weakness. Workup also demonstrates metastatic disease extensively through multiple vertebral bodies and paravertebral in his thoracic spine and L1 vertebral body. Per neurosurgery, Pt with some degree of L L1 compression fx.       SLP Plan  Continue with current plan of  care     Recommendations  Diet recommendations: Nectar-thick liquid Liquids provided via: Cup;Straw Medication Administration: Whole meds with puree Supervision: Full supervision/cueing for compensatory strategies Compensations: Slow rate;Small sips/bites Postural Changes and/or Swallow Maneuvers: Seated upright 90 degrees;Upright 30-60 min after meal             Plan: Continue with current plan of care     GO                Nadiyah Zeis, Katherene Ponto 07/31/2015, 9:06 AM

## 2015-07-31 NOTE — Progress Notes (Signed)
Physical Therapy Treatment Patient Details Name: James Small MRN: [id number] DOB: October 18, 1922 Today's Date: 07/31/2015    History of Present Illness 82 year old male admitted following a mechanical fall at home. Patient with intense upper lumbar pain without obvious radiation. 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 Patient declined surgery at that time. Patient also with significant long-standing lumbar stenosis at L4-5 and L5-S1. Patient chronically limited by back pain with some lower extremity symptoms. Patient with chronic urinary incontinence status post placement of suprapubic catheter. Patient has been suffering with severe back pain and spasms. No new obvious complaints of sensory loss or weakness. Workup also demonstrates metastatic disease extensively through multiple vertebral bodies and paravertebral in his thoracic spine and L1 vertebral body. Per NSU, some degree of an acute left-sided pathologic/minimal compression fracture of L1    PT Comments    Focus on active exercises and bed mobility due to patient's report of incr Lt hip pain (anticipate due to his many back issues). Patient is motivated to work with therapy, however pain more limiting this date. Will attempt to coordinate with pain medicine schedule, although meds apparently make him very sleepy.   Follow Up Recommendations  SNF;Supervision/Assistance - 24 hour     Equipment Recommendations  None recommended by PT (TBD by next venue of care)    Recommendations for Other Services       Precautions / Restrictions Precautions Precautions: Back;Fall Precaution Booklet Issued: No Restrictions Weight Bearing Restrictions: No    Mobility  Bed Mobility Overal bed mobility: Needs Assistance;+2 for physical assistance Bed Mobility: Rolling;Supine to Sit;Sit to Supine Rolling: Max assist   Supine to sit: Max assist;HOB elevated Sit  to supine: Total assist   General bed mobility comments: HOB elevated with attempt to "pivot" to sitting EOB. pt able to walk his legs off EOB with incr time and cues; at initiating full elevation of trunk, pt becomes rigid due to pain and unable to assist; unable to come to full upright sitting; returned to supine; +2 for scooting up in bed  Transfers                    Ambulation/Gait                 Stairs            Wheelchair Mobility    Modified Rankin (Stroke Patients Only)       Balance       Sitting balance - Comments: unable to fully achieve upright sitting with lean to his Rt                            Cognition Arousal/Alertness: Lethargic (slightly) Behavior During Therapy: WFL for tasks assessed/performed Overall Cognitive Status: No family/caregiver present to determine baseline cognitive functioning Area of Impairment: Problem solving;Awareness;Orientation Orientation Level: Place;Time;Situation Current Attention Level: Sustained   Following Commands: Follows one step commands with increased time;Follows one step commands consistently Safety/Judgement: Decreased awareness of deficits   Problem Solving: Slow processing;Decreased initiation;Difficulty sequencing;Requires verbal cues;Requires tactile cues      Exercises General Exercises - Lower Extremity Ankle Circles/Pumps: AAROM;Both;5 reps Heel Slides: AAROM;Both;5 reps (Lt flexion to 45 only and then increases his pain) Hip ABduction/ADduction: AAROM;Right;Left;5 reps    General Comments General comments (skin integrity, edema, etc.): suprapubic catheter leaking with gown and blankets saturated. RN aware (reports just recently  was changed). Nurse tech in to assist with rolling and cleaning pt in supine      Pertinent Vitals/Pain Pain Assessment: Faces Faces Pain Scale: Hurts whole lot Pain Location: Lt hip with any movement Pain Descriptors / Indicators:  Grimacing;Guarding Pain Intervention(s): Limited activity within patient's tolerance;Monitored during session;Repositioned    Home Living                      Prior Function            PT Goals (current goals can now be found in the care plan section) Acute Rehab PT Goals Patient Stated Goal: family is for pt to eventually return home Time For Goal Achievement: 08/12/15 Potential to Achieve Goals: Fair Progress towards PT goals: Not progressing toward goals - comment (incr guarding/pain; need to pre-medicate)    Frequency  Min 3X/week    PT Plan Current plan remains appropriate    Co-evaluation             End of Session   Activity Tolerance: Patient limited by pain Patient left: in bed;with bed alarm set;with call bell/phone within reach;with nursing/sitter in room     Time: 1219-1253 PT Time Calculation (min) (ACUTE ONLY): 34 min  Charges:  $Therapeutic Exercise: 8-22 mins $Therapeutic Activity: 8-22 mins                    G Codes:      Vernona Peake 08-11-15, 1:03 PM Pager [phone]

## 2015-07-31 NOTE — Progress Notes (Signed)
Brief Nutrition Follow-Up Note  Case reviewed with RN. Pt is more alert today. She reports that SLP followed up with pt and was downgraded to a full liquid diet (nectar thick) due to coughing on puree food textures. RN asking about supplements; informed that Magic Cup has already been ordered and will also add Mighty Shake, for increased nutrient provisions on full liquid diet. Intake remains poor; PO: 10-25%.   RD will continue to follow.   James Small, RD, LDN, CDE Pager: [phone] After hours Pager: [phone]

## 2015-07-31 NOTE — Clinical Social Work Note (Signed)
CSW spoke with patient's daughter Collie Siad [phone] and she wanted [facility], however they declined patient, CSW presented other offers to patient's daughter and she agreed to go to [facility].  CSW faxed [facility] Medicare clinical information, CSW received phone call from [place] at [facility] her extension is [phone], and she is requesting updated PT and OT notes for patient.  CSW contacted acute rehab to see if a PT and OT can be assigned to patient to be seen today.  CSW waiting for updated therapy notes, to send to insurance company.   Jones Broom. [place], MSW, [place] 07/31/2015 1:03 PM

## 2015-07-31 NOTE — Care Management Important Message (Signed)
Important Message  Patient Details  Name: James Small MRN: [id number] Date of Birth: [birth year]   Medicare Important Message Given:  Yes    Loann Quill 07/31/2015, 10:32 AM

## 2015-07-31 NOTE — Progress Notes (Signed)
Orthopedic Tech Progress Note Patient Details:  James Small [birth year] [id number] Brace order completed by [employer] vendor. Patient ID: [name], male   DOB: [birth year], 82 y.o.   MRN: [id number]   Braulio Bosch 07/31/2015, 4:03 PM

## 2015-07-31 NOTE — Progress Notes (Signed)
Orthopedic Tech Progress Note Patient Details:  [name] 19-Apr-1922 [id number] Called [employer] for brace order. Patient ID: James Small, male   DOB: [birth year], 82 y.o.   MRN: [id number]   Braulio Bosch 07/31/2015, 3:20 PM

## 2015-08-01 LAB — GLUCOSE, CAPILLARY
GLUCOSE-CAPILLARY: 158 mg/dL — AB (ref 65–99)
Glucose-Capillary: 222 mg/dL — ABNORMAL HIGH (ref 65–99)

## 2015-08-01 LAB — BASIC METABOLIC PANEL
ANION GAP: 7 (ref 5–15)
BUN: 53 mg/dL — ABNORMAL HIGH (ref 6–20)
CALCIUM: 7.7 mg/dL — AB (ref 8.9–10.3)
CO2: 21 mmol/L — AB (ref 22–32)
CREATININE: 1.83 mg/dL — AB (ref 0.61–1.24)
Chloride: 116 mmol/L — ABNORMAL HIGH (ref 101–111)
GFR, EST AFRICAN AMERICAN: 35 mL/min — AB (ref >60)
GFR, EST NON AFRICAN AMERICAN: 31 mL/min — AB (ref >60)
Glucose, Bld: 204 mg/dL — ABNORMAL HIGH (ref 65–99)
Potassium: 3.8 mmol/L (ref 3.5–5.1)
Sodium: 144 mmol/L (ref 135–145)

## 2015-08-01 MED ORDER — IBUPROFEN 200 MG PO TABS: 200.0000 mg | ORAL_TABLET | Freq: Four times a day (QID) | ORAL | Status: AC

## 2015-08-01 MED ORDER — PANTOPRAZOLE SODIUM 20 MG PO TBEC: 20.0000 mg | DELAYED_RELEASE_TABLET | Freq: Every day | ORAL | Status: AC

## 2015-08-01 MED ORDER — BACLOFEN 10 MG PO TABS: 5.0000 mg | ORAL_TABLET | Freq: Three times a day (TID) | ORAL | Status: AC | PRN

## 2015-08-01 MED ORDER — PANTOPRAZOLE SODIUM 20 MG PO TBEC
20.0000 mg | DELAYED_RELEASE_TABLET | Freq: Every day | ORAL | Status: DC
  Administered 2015-08-01: 20 mg via ORAL
  Filled 2015-08-01: qty 1

## 2015-08-01 MED ORDER — ACETAMINOPHEN 325 MG PO TABS: 650.0000 mg | ORAL_TABLET | Freq: Four times a day (QID) | ORAL | Status: AC

## 2015-08-01 MED ORDER — RESOURCE THICKENUP CLEAR PO POWD: 1.0000 g | ORAL | Status: AC | PRN

## 2015-08-01 MED ORDER — LIDOCAINE 5 % EX PTCH: 1.0000 | MEDICATED_PATCH | CUTANEOUS | Status: AC

## 2015-08-01 MED ORDER — PREDNISONE 50 MG PO TABS: ORAL_TABLET | ORAL | Status: AC

## 2015-08-01 MED ORDER — PREGABALIN 25 MG PO CAPS: 25.0000 mg | ORAL_CAPSULE | Freq: Every day | ORAL | Status: AC

## 2015-08-01 NOTE — Clinical Social Work Note (Signed)
Patient to be d/c'ed today to [facility].  Patient and family agreeable to plans will transport via ems RN to call report to [phone].  Evette Cristal, MSW, [place]

## 2015-08-01 NOTE — Progress Notes (Signed)
Speech Language Pathology Treatment: Dysphagia  Patient Details Name: James Small MRN: [id number] DOB: 1922/03/11 Today's Date: 08/01/2015 Time: 1204-1212 SLP Time Calculation (min) (ACUTE ONLY): 8 min  Assessment / Plan / Recommendation Clinical Impression  SLP assisted with repositioning for safer PO intake, although posture remains limited secondary to pain. Pt consumed nectar thick liquids by straw with delayed cough observed x1. He is still not appropriate to participate in MBS due to positional limitations. Will continue to follow closely.   HPI HPI: 82 year old male admitted following a mechanical fall at home. 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 Patient chronically limited by back pain with some lower extremity symptoms. Patient with chronic urinary incontinence status post placement of suprapubic catheter. No new obvious complaints of sensory loss or weakness. Workup also demonstrates metastatic disease extensively through multiple vertebral bodies and paravertebral in his thoracic spine and L1 vertebral body. Per neurosurgery, Pt with some degree of L L1 compression fx.       SLP Plan  Continue with current plan of care     Recommendations  Diet recommendations: Nectar-thick liquid Liquids provided via: Cup;Straw Medication Administration: Whole meds with puree Supervision: Full supervision/cueing for compensatory strategies Compensations: Slow rate;Small sips/bites Postural Changes and/or Swallow Maneuvers: Seated upright 90 degrees;Upright 30-60 min after meal             Oral Care Recommendations: Oral care BID Follow up Recommendations: Skilled Nursing facility Plan: Continue with current plan of care     GO               Germain Osgood, M.A. CCC-SLP [phone]  Germain Osgood 08/01/2015, 12:57 PM

## 2015-08-01 NOTE — Progress Notes (Addendum)
Patient discharged to [facility] transported by [facility] as ordered. Reported to nurse Weyman Pedro RN

## 2015-08-01 NOTE — Progress Notes (Signed)
Family Medicine Teaching Service Daily Progress Note Intern Pager: [phone]  Patient name: James Small Medical record number: [id number] Date of birth: 1922/11/16 Age: 82 y.o. Gender: male  Primary Care Provider: Limmie Patricia, MD Consultants: Palliative Care, Neurosurgery Code Status: Full (confirmed upon admission)  Pt Overview and Major Events to Date:  6/12: Patient presents with worsening pain after fall  Assessment and Plan: James Small is a 81 y.o. male presenting with back pain x 1 day after fall. 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  # Back pain: Denies pain today. 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 Reviewed by Neurosurgeon Dr. Annette Stable who noted L1 compression fracture. Xray lumbar and thoracic spine showed changes consistent with ankylosing spondylitis (Denies previous history). Diffuse osteopenia and degenerative changes of thoracic spine. No radiculopathy on initial exam. No loss of bowel function.No saddle anesthesia. Majority of pain thought to be due to compression fracture. - Consulted Palliative Care for assistance with pain management and further goals of care discussion. Appreciate recommendations --> Added Lyrica for when patient is able to tolerate PO. Agreed with steroids. Confirmed with family they want patient to remain Full Code and that they are not interested in pursuing workup of metastatic disease at this time.   - Consulted Neurosurgery for advice on stabilization/possible treatment options to offer family --> Suspect L1 compression fracture is causing most of patient's acute pain. Hope for improvement over days to weeks to come. Recommended IV decadron 10 mg q6h. Lumbar corset delivered to patient room 6/19, to be used when ambulating. 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 Annette Stable does not recommend surgical excision given patient's age, functional status, and previously wishes not to pursue surgery --> Contacted Neurosurgery 6/19 and spoke with Dr. Saintclair Halsted who agreed with steroid taper, as patient's pain improved, and lumbar corset with activity - Switched IV decadron to PO prednisone (1:5 conversion) at half current dosing to prednisone 50 mg BID 6/18. Plan to taper down every 5 days. - Obtain PSA --> Elevated at 129.23.  - Ordered lidocaine patch for non-sedating, local pain control - Continue scheduled tylenol and ibuprofen (has renal dysfunction but also with diffuse mets) - Discontinued opioid pain medication - Baclofen 5 mg TID scheduled to prn - PT/OT recommending SNF --> family agreeable - Start PPI for steroid use and NSAIDs  # Delirium with agitation: Continues to improve; AOx2.5 (doesn't know year) but still somewhat confused. Contributing factors include pain, pain medications, hospitalization in elderly patient. - Encouraged family to speak with him and try to orient him - Keep blinds open during day  #HTN: Tachycardia resolved. BPs improved. - Continue home atenolol 25 mg daily  - Vitals per unit protocol   # AKI in setting of CKD: Resolved. 1.83 << 2.26. Baseline SCr ~ 2.0 (according to labs with SCr 2.07 from 06/26/15). Daughter says renal function has been stable with indwelling suprapubic cath. May be due to dehydration from decreased PO 2/2 pain, but initial BUN:Cr ratio < 20. - AM BMPs every other day  # Hyperglycemia: Patient with history of pre-diabetes. Had been on decadron.  - Discontinue CBG checks and SSI, as have decreased steroid dose.  # Leukocytosis: Increased in setting of steroid administration. Remains afebrile. WBC 14.8 < 15.9 < 18.3 < 19.3 < 13.2 < 13.8 <  15.4 on admission, previously elevated to 10.9 with last labs 06/26/15. UA with leukocytosis and micro with bacteria in  setting of indwelling cath. Suspect chronic colonization. McGeer's Criteria not met, with patient afebrile without rigors or hypotension, no changes in mental status prior to receiving pain medications, and no purulent discharge.  - Continue to monitor CBCs every other day - Suprapubic cath replaced 6/15 - Antibiotics not indicated, consider if clinically changes  # BPH and neurogenic bladder: Not medically managed. Has indwelling catheter.  - Flush catheter three times weekly  # CAD s/p CABG in 1996: Follows with Dr. Percival Spanish. Seen 07/18/15 and no medication changes suggested. Myoview from 04/2013 showed no high risk findings.  - Continue home aspirin 81 mg BID - Continue home atorvastatin 10 mg QHS  # Hypothyroidism: - Home synthroid 150 mcg   # Poor appetite: Improved from yesterday. - Continue home megace 3 mL BID when eating (family preference) - Will need ongoing speech evaluations - Nutrition consulted, appreciate recommendations  FEN/GI: Liquid diet, oral care, SLIV  Prophylaxis: Lovenox (Renally Dosed)  Disposition: Admitted for pain management and placement evaluation. Anticipate discharge to SNF today.  Subjective:  Patient fully conversant this morning. He is eager to share stories about his family and denies back pain unless he tries to move.   Objective: Temp:  [97.5 F (36.4 C)-98.8 F (37.1 C)] 98.4 F (36.9 C) (06/20 0513) Pulse Rate:  [73-84] 80 (06/20 0513) Resp:  [17-22] 17 (06/20 0513) BP: (123-158)/(66-86) 158/81 mmHg (06/20 0513) SpO2:  [90 %-97 %] 97 % (06/20 0513) Physical Exam: General: Elderly male, sitting back in bed Cardiovascular: RRR, S1, S2, no m/r/g Respiratory: CTAB, no increased WOB Abdomen: +BS, S, ND, NT, suprapubic catheter in place MSK: Normal tone. Moving all extremities spontaneously. Lifted legs on command with 5-/5 strength bilaterally. No TTP over thoracic/lumbar spine.  Skin: 3 cm healing laceration over right elbow Neuro:  AOx2.5 (did not know year). Awake. Speech still slightly slurred but improved. Occasionally tangential.  Psych: Normal mood and affect.   Laboratory:  Recent Labs Lab 07/29/15 0330 07/30/15 0216 07/31/15 1013  WBC 15.9* 14.8* 20.5*  HGB 13.0 13.6 13.9  HCT 40.3 41.3 42.2  PLT 212 215 206    Recent Labs Lab 07/29/15 0330 07/30/15 0216 07/31/15 1013  NA 144 144 144  K 3.5 3.6 3.4*  CL 117* 115* 115*  CO2 18* 19* 21*  BUN 40* 49* 53*  CREATININE 1.92* 1.92* 2.04*  CALCIUM 8.3* 8.2* 8.1*  GLUCOSE 276* 208* 209*   Imaging/Diagnostic Tests: No results found.   MRI thoracic and lumbar spine 07/24/15: MRI thoracic/lumbar spine shows metastases throughout thoracic verterbral bodies into left costovertebral junction with mass of L 7th rib and matastasis to R 7th rib, as well as tumor into prevertebral space at C6-C7, R iliac metastasis, and mass at L1-L2 slightly larger (12x8x20 mm vs 11x14x16 mm in Jan 2012) with effacement of thecal sac.   Rogue Bussing, MD 08/01/2015, 7:32 AM PGY-1, [place] Intern pager: [phone], text pages welcome

## 2015-08-01 NOTE — Clinical Social Work Note (Signed)
CSW received [facility] authorization [phone] [phone] for patient to go to SNF at [facility], insurance worker is Apolonio Schneiders extension [phone].  Patient does have a bed available today if patient is medically ready for discharge.  CSW spoke to patient's daughter Terri Skains [phone] who would like to be updated with patient's progress by physician.  CSW to continue to follow patient's progress and will arrange transportation to SNF once patient is medically ready for discharge, discharge summary complete, and discharge orders have been received.  Jones Broom. [place], MSW, [place] 08/01/2015 10:45 AM

## 2015-08-04 ENCOUNTER — Non-Acute Institutional Stay: Payer: Medicare Other | Admitting: Family Medicine

## 2015-08-04 DIAGNOSIS — G959 Disease of spinal cord, unspecified: Secondary | ICD-10-CM

## 2015-08-04 DIAGNOSIS — C7951 Secondary malignant neoplasm of bone: Secondary | ICD-10-CM | POA: Diagnosis not present

## 2015-08-04 DIAGNOSIS — G9589 Other specified diseases of spinal cord: Secondary | ICD-10-CM

## 2015-08-04 DIAGNOSIS — S32010D Wedge compression fracture of first lumbar vertebra, subsequent encounter for fracture with routine healing: Secondary | ICD-10-CM

## 2015-08-04 DIAGNOSIS — E038 Other specified hypothyroidism: Secondary | ICD-10-CM | POA: Diagnosis not present

## 2015-08-04 DIAGNOSIS — Z936 Other artificial openings of urinary tract status: Secondary | ICD-10-CM

## 2015-08-04 DIAGNOSIS — I10 Essential (primary) hypertension: Secondary | ICD-10-CM

## 2015-08-04 DIAGNOSIS — Z960 Presence of urogenital implants: Secondary | ICD-10-CM

## 2015-08-04 DIAGNOSIS — M8580 Other specified disorders of bone density and structure, unspecified site: Secondary | ICD-10-CM

## 2015-08-04 DIAGNOSIS — R400 Somnolence: Secondary | ICD-10-CM

## 2015-08-04 DIAGNOSIS — R630 Anorexia: Secondary | ICD-10-CM

## 2015-08-04 NOTE — Progress Notes (Signed)
[facility]  Visit  Primary Care Provider: DR. Altheimer  Location of Care: [facility] Visit Information: admission Patient accompanied by patient and son Source(s) of information for visit: patient and relative(s)  Chief Complaint:  Chief Complaint  Patient presents with  . Establish Care   Mr. Coultas is a 82 yo m that was recently admitted for back pain after a mechanical fall. He was at home and his pants fell around his ankles which led to his fall. He has PMH for chronic back pain, CAD s/p CABG, BPH, and neurogenic bladder with indwelling suprapubic catheter, hypothyroidism, intrathecal mass at L2.   His x-rays from his admission showed changes consistent with ankylosing  spondylitis for which he has no prior history. It also showed osteopenia and degenerative changes of thoracic spine. MRI showed spinal metastasis of unknown origin. Dr. Annette Stable (Neurosurgeon) reviewed the imaging and noted a L1 compression fracture and thought the L1-L2 mass to be a benign nerve sheath tumor for he does not recommend surgery secondary to the patient's age.    Palliative care was consulted once the spinal metastasis was found on his MRI. The family currently does not wish to pursue work up. Family wishes for him to remain a full code.   Nursing Concerns: none currently   Nutrition Concerns: poor appetite   Wound Care Nurse Concerns: none  PT Concerns and Goals:  improved activity tolerance, improved balance and improved coordination;  ongoing  OT Concerns and Goals: to improve strength and balance to try to achieve function prior to his recent admission.    If SNF admission, patient's goal for the rehabilitation admission:  Goals identified by the patient:;  increase overall strength and endurance ; Moderate Assistance    Family Goals: return to his baseline. He was living independing prior to hospital admission.    If SNF admission, discharge disposition goals:  with paid  help   HISTORY OF PRESENT ILLNESS: Outpatient Encounter Prescriptions as of 08/04/2015  Medication Sig  . acetaminophen (TYLENOL) 325 MG tablet Take 2 tablets (650 mg total) by mouth every 6 (six) hours.  Marland Kitchen aspirin EC 81 MG tablet Take 81 mg by mouth 2 (two) times daily.  Marland Kitchen atenolol (TENORMIN) 50 MG tablet TAKE 1/2 TABLET BY MOUTH ONCE DAILY  . atorvastatin (LIPITOR) 10 MG tablet Take 10 mg by mouth at bedtime.   . baclofen (LIORESAL) 10 MG tablet Take 0.5 tablets (5 mg total) by mouth 3 (three) times daily as needed for muscle spasms.  . cetirizine (ZYRTEC) 10 MG tablet Take 10 mg by mouth daily.  Marland Kitchen ibuprofen (ADVIL,MOTRIN) 200 MG tablet Take 1 tablet (200 mg total) by mouth 4 (four) times daily.  Marland Kitchen levothyroxine (SYNTHROID, LEVOTHROID) 150 MCG tablet Take 150 mcg by mouth daily before breakfast.  . lidocaine (LIDODERM) 5 % Place 1 patch onto the skin daily. Remove & Discard patch within 12 hours or as directed by MD  . Maltodextrin-Xanthan Gum (RESOURCE THICKENUP CLEAR) POWD Take 1 g by mouth as needed.  . megestrol (MEGACE) 400 MG/10ML suspension Take by mouth 2 (two) times daily.  . Multiple Vitamins-Minerals (MULTI COMPLETE PO) Take 1 tablet by mouth daily.   . pantoprazole (PROTONIX) 20 MG tablet Take 1 tablet (20 mg total) by mouth daily.  . predniSONE (DELTASONE) 50 MG tablet Continue taper with 50 mg BID through 6/22.  . pregabalin (LYRICA) 25 MG capsule Take 1 capsule (25 mg total) by mouth daily.   No facility-administered encounter medications  on file as of 08/04/2015.   No Known Allergies History Patient Active Problem List   Diagnosis Date Noted  . Osteopenia determined by x-ray 08/07/2015  . Lack of appetite 08/07/2015  . Indwelling catheter present on admission ([place]) 08/07/2015  . Sleepiness 08/07/2015  . Compression fracture of L1 lumbar vertebra ([place]) 08/07/2015  . CKD (chronic kidney disease), stage III   . Acute delirium   . Palliative care encounter   . Goals of  care, counseling/discussion   . Bone metastases ([place])   . Intradural mass ([place])   . Back injury 07/24/2015  . Back pain 07/24/2015  . Back injuries   . Fall   . Renal insufficiency   . High risk social situation   . ESSENTIAL HYPERTENSION, BENIGN 12/06/2008  . Hypothyroidism 12/01/2008  . DYSLIPIDEMIA 12/01/2008  . Coronary atherosclerosis 12/01/2008   Past Medical History  Diagnosis Date  . Inguinal hernia     left  . Thyroid disease   . Dyslipidemia   . BPH (benign prostatic hyperplasia)   . Coronary artery disease     status post cabg in 1996 by [place] [facility] with LIMA to the LAD, SVG to D1 and D2, SVG to circumflex, SVG to right coronary artery  . Hypertension   . Hypothyroidism   . Chronic kidney disease   . Presence of indwelling urinary catheter   . Weight loss     25 lbs over last few months per pt   . Arthritis     lower back - pt has epidural injections every few months   . Cancer [facility])     metasatic / spine    Past Surgical History  Procedure Laterality Date  . Coronary artery bypass graft      1996  . Cyst on necksurgery      Family History  Problem Relation Age of Onset  . Family history unknown: Yes    reports that he has never smoked. He has never used smokeless tobacco. He reports that he does not drink alcohol or use illicit drugs.  Basic Activities of Daily Living   ADLs Independent Needs Assistance Dependent  Bathing x    Dressing x    Ambulation x    Toileting x    Eating x       Instrumental Activities of Daily Living  IADL Independent Needs Assistance Dependent  Cooking  x   Housework  x   Manage Medications x    Manage the telephone x    Shopping for food, clothes, Meds, etc   x  Use transportation   x  Manage Finances x      Falls in the past six months:   Yes, 2   Diet:  puree with Nectar thick liquids  Nourishment: ensure due to Inadequate oral intake  Nutritional Supplements:  Medpass: no Magic Cup:no  Prostat:no   Juven:no    Review of Systems  Patient has ability to communicate answers to ROS: no See HPI  Geriatric Syndromes: Constipation no ,   Incontinence has limited mobilty since fall due to back pain. History of suprapubic catheter   Dizziness no   Syncope no   Skin problems no   Visual Impairment no   Hearing impairment yes, hard of hearing. Does not use hearing aids.  Eating impairment yes, has had a lack of appetite since admission to the hospital  Impaired Memory or Cognition no   Behavioral problems no   Sleep problems no  Weight loss no    Pain:  Pain Location: Back Pain Rating: He rates his pain as moderate. 5/10.  Pain Duration: days Pain Therapies: lidoderm Pain Response to Therapies: Location: back; Severity: 5/10 Bowel Movement Difficulty: no   General: Denies fevers, chills, progressive fatigue, weight gain.  Eyes: Denies pain, blurred vision  Ears/Nose/Throat: Denies ear pain, throat pain, rhinorrhea, nasal congestion.  Cardiovascular: Denies chest pains, palpitations, dyspnea on exertion, orthopnea, peripheral edema.  Respiratory: Denies cough, sputum, dyspnea  Gastrointestinal: Denies abdominal pain, bloating, constipation, diarrhea.  Genitourinary: Denies dysuria, urinary frequency, discharge Musculoskeletal: Denies joint pain, swelling, weakness.  Skin: Denies skin rash or ulcers. Neurologic: Denies transient paralysis, weakness, paresthesias, headache.  Psychiatric: Denies depression, anxiety, psychosis. Endocrine: Denies weight loss   PHYSICAL EXAM:. Wt Readings from Last 3 Encounters:  07/29/15 181 lb 9.6 oz (82.373 kg)  07/19/15 181 lb 9.6 oz (82.373 kg)  06/07/15 180 lb (81.647 kg)   Temp Readings from Last 3 Encounters:  08/07/15 98.5 F (36.9 C)   08/01/15 98.4 F (36.9 C) Oral  06/07/15 98 F (36.7 C) Oral   BP Readings from Last 3 Encounters:  08/07/15 121/70  08/01/15 131/64  07/19/15 124/68   Pulse Readings from Last 3  Encounters:  08/07/15 79  08/01/15 77  07/19/15 88    General: fatigued, cooperative, no distress, well nourished, pleasant, clean, groomed HEENT:  No scleral icterus, no nasal secretions, [place] moist and no erythema or lesion Neck:  Supple, No JVD, no lymphadenopathy CV:  RRR, no murmur, trace ankle swelling RESP: No resp distress or accessory muscle use.  Clear to ausc bilat. No wheezing, no rales, no rhonchi.  ABD:  Soft, Non-tender, non-distended, +bowel sounds, no masses, indwelling catheter site clean, dry and intact  MSK:  No back pain, no joint pain.  No joint swelling or redness EXT: Warm and well perfused    Gait:  Trying to stand with walker.  Skin: ecchymosis occurring on his posterior left forearm from IV sticks  Neurologic:Cranial nerves normal;  Muscle Tone within normal limits; Motor Strength: normal Sensation: WFL by patient report; Cerebellar: no tremors noted;  Intact Psych:  Orientation oriented to person, place, time, and general circumstances; Judgment Good, Insight Intact Memory recent and remote memory intact; Attention Normal;  Mood appropriate; Speech is more gurgled than normal; Language hearing disability ; Thought Coherent   No flowsheet data found. No flowsheet data found.  Years of Education: 12 +  Assessment and Plan:   See Problem List for individual problem's assessment and plans.   Family communications: Son Antony Haste), Daughter Terri Skains    Advanced Directives (MOST form, Living Will, HCPOA): Antony Haste and Collie Siad  Code Status:     Intubation Status: No Intravenous Fluids:  Unsure  Feeding Tubes: unsure Antibiotics: Yes Hospitalization: Yes Emergency contact:  Antony Haste [phone]   Follow Up:  Next 7 days unless acute issues arise.    ESSENTIAL HYPERTENSION, BENIGN Controlled on atenolol    Hypothyroidism Unclear of the cause of his hypothyroidism.  TSH was with normal limits during admission  Continue synthroid 150 mcg for now  Suggest  another TSH in 6 weeks to evaluate TSH and consider titration down of synthroid.   Bone metastases (HCC) Unclear of the source  PSA was found to be elevated during his admission  Family does not wish to pursue palliative care at this time  Continue to monitor   Intradural mass [facility]) Neurosurgery suggested this was benign and would not pursue  removal given his age   Osteopenia determined by x-ray X-rays showing thoracic osteopenia   Lack of appetite He has been on megace which was started by his endocrinologist about a year now.  His weight has maintained while he has been on it but has dropped recently   Recently he does not seem interested in eating anything other than ensure  His family is wishing to increase the megace   Speech therapy saw him while admitted but was unable to place him in a position to evaluate him secondary to his back pain  Current diet is puree with NTL  Placed a Speech eval and treat   Indwelling catheter present on admission ([place]) History of BPH and neurogenic bladder  Site does not show signs of infection  Sees Dr. Risa Grill at [facility] urology  Catheter gets flushed three times weekly and changed monthly    Compression fracture of L1 lumbar vertebra ([place]) Observed on MRI by neurosurgery  They suggested this was the cause of his acute pain after his fall and he should regain range of motion in the coming weeks. He is to use a lumbar corset when ambulating  Started on decadron while inpatient and has a prednisone taper lidoderm patch is placed to help with pain  Lyrica and PRN baclofen were held on admission secondary to patient's sleepiness.     Sleepiness Has had delirium during admission  No prior history of dementia and was functioning almost independently prior to admission  Doesn't appear to have any medications that seem to be contributing, not receiving narcotis and holding lyrica and baclofen  He may not be getting sleep secondary to his back  pain  He will wake up with interaction and answer questions appropriately  - if he continues to be sleepy then would consider placing a CT head with the history of his recentl fall.

## 2015-08-07 DIAGNOSIS — R400 Somnolence: Secondary | ICD-10-CM | POA: Insufficient documentation

## 2015-08-07 DIAGNOSIS — R630 Anorexia: Secondary | ICD-10-CM | POA: Insufficient documentation

## 2015-08-07 DIAGNOSIS — M8580 Other specified disorders of bone density and structure, unspecified site: Secondary | ICD-10-CM | POA: Insufficient documentation

## 2015-08-07 DIAGNOSIS — Z960 Presence of urogenital implants: Secondary | ICD-10-CM | POA: Insufficient documentation

## 2015-08-07 DIAGNOSIS — S32010A Wedge compression fracture of first lumbar vertebra, initial encounter for closed fracture: Secondary | ICD-10-CM | POA: Insufficient documentation

## 2015-08-07 NOTE — Assessment & Plan Note (Signed)
Observed on MRI by neurosurgery  They suggested this was the cause of his acute pain after his fall and he should regain range of motion in the coming weeks. He is to use a lumbar corset when ambulating  Started on decadron while inpatient and has a prednisone taper lidoderm patch is placed to help with pain  Lyrica and PRN baclofen were held on admission secondary to patient's sleepiness.

## 2015-08-07 NOTE — Progress Notes (Signed)
FMTS Attending Note  I personally saw and evaluated the patient on 08/04/15 . The plan of care was discussed with the resident team. I agree with the assessment and plan as documented by the resident.  HPI  82 y/o male admitted to [facility] home after admission to [facility] from 07/24/15 - 08/01/15. Reviewed Discharge summary. Primary indication for hospitalization was back pain after fall at home. Review of imaging is provided in Dr. Raeford Razor note.   In addition to HPI provided in Dr. Raeford Razor note the patient was found to have elevated PSA (129.23). Palliative care was involved and family elected for no further workup. Patient was to remain full code.   Other Hospital issues included Delirium which improved with decreased narcotic regiemn, HTN, AKI on CKD, Leukocytosis in the setting of steroid use, BPH with Neurogenic Bladder (has indwelling catheter), CAD s/p CABG, Hypothyroidism, and poor appetite.   Patient alert to person, place, time however unable to provide additional HPI. Family members not present at my time of exam. Dr. Raeford Razor spoke directly with the patient's sone.   OBJECTIVE Gen: pleasant male, NAD HEENT: normocephalic, PERRL, EOMI, dry mucous membranes Cardiac: RRR, S1 and S2 present, no murmur Resp: CTAB, normal effort Abd: soft, no tenderness, normal bowel sounds GU: indwelling foley catheter, no surrounding erythema, mild leakage of urine Ext: trace edema, left foot cooler than right however pulses normal (left foot 2+ DP, 1-2 second capillary refill; right LE 2+ PT pulse, difficulty to palpate DP, 1-2 second capillary refill). Neuro: Oriented to person, place (states as hospital), and location [facility]). Able to move all extremities, strength grossly 3+/5, difficult for patient to follow commands MSK: Lumbar - overlying lidocaine patch, no tenderness, pain with rolling over Skin: no lumbar/sacral ulcers  Assessment and Plan: 1. Lumbar Back  Pain/Compression Fracture L1 - agree with lumbar corset, PT/OT, complete prednisone taper per Neurosurgery recs.  2. Bone Mets - likely prostate given high PSA, family elects for no further workup, would suggest follow up with Urology in next 1-2 weeks if patient shows clinical improvement 3. Elevated PSA - suspect prostate CA, If patient/family continue to not wish to pursue palliative care would schedule follow up with Urology.   4. Delirium/Encephalopathy - unclear baseline, appears sleepy/somnolent on my exam, agree with holding Lyrica/Baclofen as these may lead to worsening somnolence, of note Dr. Raeford Razor re-evaluated the patient on 6/26 and his mental status was significantly improved. Monitor Clinically.  5. Decreased Appetite - agree with speech eval and treat, Dr. Raeford Razor to discuss risks/benefits of Megace with family/patient.  6. Intradural Mass - no plan for surgery per Neurosurgery, continue steroid taper.  7. Hypothyroidism - agree with Synthroid 150 mcg and periodic TSH checks 8. HTN - controlled with Atenolol 9. Osteopenia - Likely related to bone mets. Consider Dexa if functional status improves.   Dossie Arbour MD

## 2015-08-07 NOTE — Assessment & Plan Note (Addendum)
He has been on megace which was started by his endocrinologist about a year now.  His weight has maintained while he has been on it but has dropped recently   Recently he does not seem interested in eating anything other than ensure  His family is wishing to increase the megace   Speech therapy saw him while admitted but was unable to place him in a position to evaluate him secondary to his back pain  Current diet is puree with NTL  Placed a Speech eval and treat

## 2015-08-07 NOTE — Assessment & Plan Note (Signed)
Unclear of the source  PSA was found to be elevated during his admission  Family does not wish to pursue palliative care at this time  Continue to monitor

## 2015-08-07 NOTE — Assessment & Plan Note (Signed)
Has had delirium during admission  No prior history of dementia and was functioning almost independently prior to admission  Doesn't appear to have any medications that seem to be contributing, not receiving narcotis and holding lyrica and baclofen  He may not be getting sleep secondary to his back pain  He will wake up with interaction and answer questions appropriately  - if he continues to be sleepy then would consider placing a CT head with the history of his recentl fall.

## 2015-08-07 NOTE — Assessment & Plan Note (Signed)
History of BPH and neurogenic bladder  Site does not show signs of infection  Sees Dr. Risa Grill at [facility] urology  Catheter gets flushed three times weekly and changed monthly

## 2015-08-07 NOTE — Assessment & Plan Note (Signed)
X-rays showing thoracic osteopenia

## 2015-08-07 NOTE — Assessment & Plan Note (Addendum)
Neurosurgery suggested this was benign and would not pursue removal given his age

## 2015-08-07 NOTE — Assessment & Plan Note (Signed)
Unclear of the cause of his hypothyroidism.  TSH was with normal limits during admission  Continue synthroid 150 mcg for now  Suggest another TSH in 6 weeks to evaluate TSH and consider titration down of synthroid.

## 2015-08-07 NOTE — Assessment & Plan Note (Signed)
Controlled on atenolol. 

## 2015-08-12 ENCOUNTER — Telehealth: Payer: Self-pay | Admitting: Family Medicine

## 2015-08-12 NOTE — Telephone Encounter (Signed)
Received call from [facility] on after hours line. Said that the patient was leaking some urine around his suprapubic catheter that was soaking through the dressing. Staff at [facility] wanted to know if his catheter should be changed. Nurse stated that the site otherwise looked intact and that the balloon in the catheter was fully inflated. The catheter itself was draining urine and the nurse was not concerned about a blockage. I told the nurse that it could likely wait unti lMonday given that the leakage of urine was not bothering the patient or soiling his clothes or bedding. Instructed nurse to call back if leakage became more severe or bothersome to patient or if the catheter was not draining urine.   Algis Greenhouse. Jerline Pain, [place] Resident PGY-3 08/12/2015 10:09 PM

## 2015-08-13 ENCOUNTER — Encounter (HOSPITAL_COMMUNITY): Payer: Self-pay | Admitting: Emergency Medicine

## 2015-08-13 ENCOUNTER — Inpatient Hospital Stay (HOSPITAL_COMMUNITY): Payer: Medicare Other

## 2015-08-13 ENCOUNTER — Emergency Department (HOSPITAL_COMMUNITY): Payer: Medicare Other

## 2015-08-13 ENCOUNTER — Telehealth: Payer: Self-pay | Admitting: Family Medicine

## 2015-08-13 ENCOUNTER — Inpatient Hospital Stay (HOSPITAL_COMMUNITY)
Admission: EM | Admit: 2015-08-13 | Discharge: 2015-09-12 | DRG: 377 | Disposition: E | Payer: Medicare Other | Attending: Family Medicine | Admitting: Family Medicine

## 2015-08-13 DIAGNOSIS — R627 Adult failure to thrive: Secondary | ICD-10-CM | POA: Diagnosis present

## 2015-08-13 DIAGNOSIS — I130 Hypertensive heart and chronic kidney disease with heart failure and stage 1 through stage 4 chronic kidney disease, or unspecified chronic kidney disease: Secondary | ICD-10-CM | POA: Diagnosis present

## 2015-08-13 DIAGNOSIS — C7951 Secondary malignant neoplasm of bone: Secondary | ICD-10-CM | POA: Diagnosis present

## 2015-08-13 DIAGNOSIS — R571 Hypovolemic shock: Secondary | ICD-10-CM | POA: Diagnosis present

## 2015-08-13 DIAGNOSIS — M549 Dorsalgia, unspecified: Secondary | ICD-10-CM | POA: Diagnosis present

## 2015-08-13 DIAGNOSIS — R740 Nonspecific elevation of levels of transaminase and lactic acid dehydrogenase [LDH]: Secondary | ICD-10-CM | POA: Diagnosis present

## 2015-08-13 DIAGNOSIS — R6510 Systemic inflammatory response syndrome (SIRS) of non-infectious origin without acute organ dysfunction: Secondary | ICD-10-CM | POA: Diagnosis present

## 2015-08-13 DIAGNOSIS — Z9359 Other cystostomy status: Secondary | ICD-10-CM

## 2015-08-13 DIAGNOSIS — I2510 Atherosclerotic heart disease of native coronary artery without angina pectoris: Secondary | ICD-10-CM | POA: Diagnosis present

## 2015-08-13 DIAGNOSIS — Z515 Encounter for palliative care: Secondary | ICD-10-CM | POA: Diagnosis not present

## 2015-08-13 DIAGNOSIS — N184 Chronic kidney disease, stage 4 (severe): Secondary | ICD-10-CM | POA: Diagnosis not present

## 2015-08-13 DIAGNOSIS — R000 Tachycardia, unspecified: Secondary | ICD-10-CM | POA: Diagnosis not present

## 2015-08-13 DIAGNOSIS — R7401 Elevation of levels of liver transaminase levels: Secondary | ICD-10-CM | POA: Insufficient documentation

## 2015-08-13 DIAGNOSIS — G9340 Encephalopathy, unspecified: Secondary | ICD-10-CM | POA: Diagnosis not present

## 2015-08-13 DIAGNOSIS — N319 Neuromuscular dysfunction of bladder, unspecified: Secondary | ICD-10-CM | POA: Diagnosis present

## 2015-08-13 DIAGNOSIS — G8929 Other chronic pain: Secondary | ICD-10-CM | POA: Diagnosis present

## 2015-08-13 DIAGNOSIS — D649 Anemia, unspecified: Secondary | ICD-10-CM | POA: Diagnosis present

## 2015-08-13 DIAGNOSIS — E039 Hypothyroidism, unspecified: Secondary | ICD-10-CM | POA: Diagnosis present

## 2015-08-13 DIAGNOSIS — N179 Acute kidney failure, unspecified: Secondary | ICD-10-CM | POA: Diagnosis present

## 2015-08-13 DIAGNOSIS — E872 Acidosis, unspecified: Secondary | ICD-10-CM

## 2015-08-13 DIAGNOSIS — K921 Melena: Principal | ICD-10-CM | POA: Diagnosis present

## 2015-08-13 DIAGNOSIS — I959 Hypotension, unspecified: Secondary | ICD-10-CM | POA: Insufficient documentation

## 2015-08-13 DIAGNOSIS — I5022 Chronic systolic (congestive) heart failure: Secondary | ICD-10-CM | POA: Diagnosis present

## 2015-08-13 DIAGNOSIS — Z7982 Long term (current) use of aspirin: Secondary | ICD-10-CM

## 2015-08-13 DIAGNOSIS — Z7189 Other specified counseling: Secondary | ICD-10-CM | POA: Diagnosis not present

## 2015-08-13 DIAGNOSIS — K922 Gastrointestinal hemorrhage, unspecified: Secondary | ICD-10-CM | POA: Diagnosis present

## 2015-08-13 DIAGNOSIS — E785 Hyperlipidemia, unspecified: Secondary | ICD-10-CM | POA: Diagnosis present

## 2015-08-13 DIAGNOSIS — Z951 Presence of aortocoronary bypass graft: Secondary | ICD-10-CM | POA: Diagnosis not present

## 2015-08-13 DIAGNOSIS — R410 Disorientation, unspecified: Secondary | ICD-10-CM | POA: Diagnosis not present

## 2015-08-13 DIAGNOSIS — I9589 Other hypotension: Secondary | ICD-10-CM

## 2015-08-13 DIAGNOSIS — Z789 Other specified health status: Secondary | ICD-10-CM | POA: Diagnosis not present

## 2015-08-13 DIAGNOSIS — C61 Malignant neoplasm of prostate: Secondary | ICD-10-CM | POA: Diagnosis present

## 2015-08-13 DIAGNOSIS — N400 Enlarged prostate without lower urinary tract symptoms: Secondary | ICD-10-CM | POA: Diagnosis present

## 2015-08-13 DIAGNOSIS — R0600 Dyspnea, unspecified: Secondary | ICD-10-CM

## 2015-08-13 DIAGNOSIS — T68XXXA Hypothermia, initial encounter: Secondary | ICD-10-CM | POA: Diagnosis present

## 2015-08-13 DIAGNOSIS — Z79899 Other long term (current) drug therapy: Secondary | ICD-10-CM

## 2015-08-13 DIAGNOSIS — Z66 Do not resuscitate: Secondary | ICD-10-CM | POA: Diagnosis not present

## 2015-08-13 DIAGNOSIS — R0682 Tachypnea, not elsewhere classified: Secondary | ICD-10-CM

## 2015-08-13 LAB — PROTIME-INR
INR: 1.34 (ref 0.00–1.49)
Prothrombin Time: 16.7 seconds — ABNORMAL HIGH (ref 11.6–15.2)

## 2015-08-13 LAB — COMPREHENSIVE METABOLIC PANEL
ALK PHOS: 131 U/L — AB (ref 38–126)
ALT: 557 U/L — AB (ref 17–63)
ANION GAP: 10 (ref 5–15)
AST: 196 U/L — ABNORMAL HIGH (ref 15–41)
Albumin: 2.5 g/dL — ABNORMAL LOW (ref 3.5–5.0)
BILIRUBIN TOTAL: 0.7 mg/dL (ref 0.3–1.2)
BUN: 69 mg/dL — ABNORMAL HIGH (ref 6–20)
CALCIUM: 8 mg/dL — AB (ref 8.9–10.3)
CO2: 20 mmol/L — ABNORMAL LOW (ref 22–32)
CREATININE: 2.22 mg/dL — AB (ref 0.61–1.24)
Chloride: 110 mmol/L (ref 101–111)
GFR, EST AFRICAN AMERICAN: 28 mL/min — AB (ref >60)
GFR, EST NON AFRICAN AMERICAN: 24 mL/min — AB (ref >60)
Glucose, Bld: 146 mg/dL — ABNORMAL HIGH (ref 65–99)
Potassium: 4.7 mmol/L (ref 3.5–5.1)
Sodium: 140 mmol/L (ref 135–145)
TOTAL PROTEIN: 5.5 g/dL — AB (ref 6.5–8.1)

## 2015-08-13 LAB — URINALYSIS, ROUTINE W REFLEX MICROSCOPIC
Bilirubin Urine: NEGATIVE
Glucose, UA: NEGATIVE mg/dL
KETONES UR: NEGATIVE mg/dL
NITRITE: NEGATIVE
PH: 8.5 — AB (ref 5.0–8.0)
Protein, ur: 100 mg/dL — AB
Specific Gravity, Urine: 1.02 (ref 1.005–1.030)

## 2015-08-13 LAB — HEMOGLOBIN
HEMOGLOBIN: 8.6 g/dL — AB (ref 13.0–17.0)
HEMOGLOBIN: 7.8 g/dL — AB (ref 13.0–17.0)
HEMOGLOBIN: 10.5 g/dL — AB (ref 13.0–17.0)
Hemoglobin: 9.3 g/dL — ABNORMAL LOW (ref 13.0–17.0)

## 2015-08-13 LAB — URINE MICROSCOPIC-ADD ON: Squamous Epithelial / LPF: NONE SEEN

## 2015-08-13 LAB — I-STAT CG4 LACTIC ACID, ED: Lactic Acid, Venous: 2.34 mmol/L (ref 0.5–1.9)

## 2015-08-13 LAB — CBC
HCT: 33.3 % — ABNORMAL LOW (ref 39.0–52.0)
HEMOGLOBIN: 10.8 g/dL — AB (ref 13.0–17.0)
MCH: 30.6 pg (ref 26.0–34.0)
MCHC: 32.4 g/dL (ref 30.0–36.0)
MCV: 94.3 fL (ref 78.0–100.0)
PLATELETS: 152 10*3/uL (ref 150–400)
RBC: 3.53 MIL/uL — AB (ref 4.22–5.81)
RDW: 14 % (ref 11.5–15.5)
WBC: 29 10*3/uL — AB (ref 4.0–10.5)

## 2015-08-13 LAB — PREPARE RBC (CROSSMATCH)

## 2015-08-13 LAB — LACTIC ACID, PLASMA
LACTIC ACID, VENOUS: 5.1 mmol/L — AB (ref 0.5–1.9)
Lactic Acid, Venous: 2.4 mmol/L (ref 0.5–1.9)

## 2015-08-13 LAB — AMMONIA: Ammonia: 60 umol/L — ABNORMAL HIGH (ref 9–35)

## 2015-08-13 LAB — ABO/RH: ABO/RH(D): A POS

## 2015-08-13 LAB — ACETAMINOPHEN LEVEL

## 2015-08-13 LAB — MRSA PCR SCREENING: MRSA BY PCR: POSITIVE — AB

## 2015-08-13 MED ORDER — SODIUM CHLORIDE 0.9 % IV SOLN
80.0000 mg | Freq: Once | INTRAVENOUS | Status: AC
  Administered 2015-08-13: 80 mg via INTRAVENOUS
  Filled 2015-08-13: qty 80

## 2015-08-13 MED ORDER — ONDANSETRON HCL 4 MG/2ML IJ SOLN
4.0000 mg | Freq: Once | INTRAMUSCULAR | Status: AC
Start: 2015-08-13 — End: 2015-08-13
  Administered 2015-08-13: 4 mg via INTRAVENOUS
  Filled 2015-08-13: qty 2

## 2015-08-13 MED ORDER — SODIUM CHLORIDE 0.9 % IV SOLN
INTRAVENOUS | Status: DC
  Administered 2015-08-13 – 2015-08-14 (×3): via INTRAVENOUS

## 2015-08-13 MED ORDER — PANTOPRAZOLE SODIUM 40 MG IV SOLR: 40.0000 mg | Freq: Two times a day (BID) | INTRAVENOUS | Status: DC

## 2015-08-13 MED ORDER — SODIUM CHLORIDE 0.9 % IV BOLUS (SEPSIS)
500.0000 mL | Freq: Once | INTRAVENOUS | Status: AC
  Administered 2015-08-13: 500 mL via INTRAVENOUS

## 2015-08-13 MED ORDER — LIDOCAINE 5 % EX PTCH
1.0000 | MEDICATED_PATCH | CUTANEOUS | Status: DC
  Administered 2015-08-13 – 2015-08-14 (×2): 1 via TRANSDERMAL
  Filled 2015-08-13 (×2): qty 1

## 2015-08-13 MED ORDER — PIPERACILLIN-TAZOBACTAM 3.375 G IVPB
3.3750 g | Freq: Three times a day (TID) | INTRAVENOUS | Status: DC
  Administered 2015-08-13 (×2): 3.375 g via INTRAVENOUS
  Filled 2015-08-13 (×3): qty 50

## 2015-08-13 MED ORDER — SODIUM CHLORIDE 0.9 % IV BOLUS (SEPSIS)
1000.0000 mL | Freq: Once | INTRAVENOUS | Status: AC
  Administered 2015-08-13: 1000 mL via INTRAVENOUS

## 2015-08-13 MED ORDER — PIPERACILLIN-TAZOBACTAM 3.375 G IVPB 30 MIN
3.3750 g | Freq: Once | INTRAVENOUS | Status: AC
  Administered 2015-08-13: 3.375 g via INTRAVENOUS
  Filled 2015-08-13: qty 50

## 2015-08-13 MED ORDER — ONDANSETRON HCL 4 MG/2ML IJ SOLN
4.0000 mg | Freq: Once | INTRAMUSCULAR | Status: AC
  Administered 2015-08-13: 4 mg via INTRAVENOUS
  Filled 2015-08-13: qty 2

## 2015-08-13 MED ORDER — CHLORHEXIDINE GLUCONATE CLOTH 2 % EX PADS
6.0000 | MEDICATED_PAD | Freq: Every day | CUTANEOUS | Status: DC
  Administered 2015-08-14: 6 via TOPICAL

## 2015-08-13 MED ORDER — SODIUM CHLORIDE 0.9 % IV SOLN
Freq: Once | INTRAVENOUS | Status: AC
  Administered 2015-08-13: 16:00:00 via INTRAVENOUS

## 2015-08-13 MED ORDER — PIPERACILLIN-TAZOBACTAM 3.375 G IVPB
3.3750 g | Freq: Three times a day (TID) | INTRAVENOUS | Status: DC
  Administered 2015-08-14 (×3): 3.375 g via INTRAVENOUS
  Filled 2015-08-13 (×5): qty 50

## 2015-08-13 MED ORDER — MUPIROCIN 2 % EX OINT
1.0000 "application " | TOPICAL_OINTMENT | Freq: Two times a day (BID) | CUTANEOUS | Status: DC
  Administered 2015-08-13 – 2015-08-14 (×3): 1 via NASAL
  Filled 2015-08-13: qty 22

## 2015-08-13 MED ORDER — FUROSEMIDE 10 MG/ML IJ SOLN
20.0000 mg | Freq: Once | INTRAMUSCULAR | Status: AC
  Administered 2015-08-13: 20 mg via INTRAVENOUS
  Filled 2015-08-13: qty 2

## 2015-08-13 MED ORDER — SODIUM CHLORIDE 0.9 % IV SOLN
8.0000 mg/h | INTRAVENOUS | Status: DC
  Administered 2015-08-13 – 2015-08-14 (×3): 8 mg/h via INTRAVENOUS
  Filled 2015-08-13 (×12): qty 80

## 2015-08-13 MED ORDER — SODIUM CHLORIDE 0.9% FLUSH
3.0000 mL | Freq: Two times a day (BID) | INTRAVENOUS | Status: DC
  Administered 2015-08-14 (×2): 3 mL via INTRAVENOUS

## 2015-08-13 MED ORDER — VANCOMYCIN HCL IN DEXTROSE 1-5 GM/200ML-% IV SOLN
1000.0000 mg | Freq: Once | INTRAVENOUS | Status: AC
  Administered 2015-08-13: 1000 mg via INTRAVENOUS
  Filled 2015-08-13: qty 200

## 2015-08-13 MED ORDER — LEVOTHYROXINE SODIUM 100 MCG IV SOLR
75.0000 ug | Freq: Every day | INTRAVENOUS | Status: DC
  Administered 2015-08-14: 75 ug via INTRAVENOUS
  Filled 2015-08-13 (×2): qty 5

## 2015-08-13 MED ORDER — SODIUM CHLORIDE 0.9 % IV SOLN
Freq: Once | INTRAVENOUS | Status: AC
  Administered 2015-08-13: 15:00:00 via INTRAVENOUS

## 2015-08-13 MED ORDER — VANCOMYCIN HCL IN DEXTROSE 1-5 GM/200ML-% IV SOLN
1000.0000 mg | INTRAVENOUS | Status: DC
  Administered 2015-08-14: 1000 mg via INTRAVENOUS
  Filled 2015-08-13 (×2): qty 200

## 2015-08-13 MED ORDER — SODIUM CHLORIDE 0.9 % IV SOLN
Freq: Once | INTRAVENOUS | Status: AC
  Administered 2015-08-13: 04:00:00 via INTRAVENOUS

## 2015-08-13 NOTE — ED Notes (Signed)
Admitting MD at bedside.

## 2015-08-13 NOTE — H&P (Signed)
[facility] Admission History and Physical Service Pager: [phone]  Patient name: James Small Medical record number: [id number] Date of birth: 1922/11/11 Age: 82 y.o. Gender: male  Primary Care Provider: Limmie Patricia, MD Consultants: GI Code Status: Full  Chief Complaint: Rectal Bleeding  Assessment and Plan: James Small is a 82 y.o. male presenting with Rectal Bleeding . PMH is significant for skeletal metastasis, CAD s/p CABG, HFrEF, BPH and neurogenic bladder with indwelling suprapubic catheter, hypothyroidism, chronic back pain.  GI Bleed. Hgb 10.8 on admission (baseline 12-14), though patient appears significant dehydrated so this may be falsely elevated. Concern for upper GI source given history of prednisone and NSAID use and duodenitis noted on abdominal CT. Acute lower GI bleed also possible (diverticulosis noted on abdominal CT). Rectal exam in ED without signs of hemorrhoids or fissures.  - Admit to step down - NPO - Place 2 large bore IVs - Trend H/H q6hrs - Protonix infusion - Will consult GI - Hold home prednisone and NSAIDs  SIRS / Lactic Acidosis. Meets 4/4 SIRS criteria on admission (though patient has history of chronic leukocytosis). Lactic acid 2.34 on admission. qSOFA 2/3 (SBP and RR). No clear source of infection, though patient appears significantly dehydrated on exam - which could be contributing.  - Blood and Urine cultures pending - Will check CXR - Empiric vanc/zosyn until cultures return - Trend lactic acid - 500cc NS given in ED, will given another bolus - NS 100cc/hr after bolus   Chronic Indwelling Suprapubic Catheter. Secondary to BPH and neurogenic bladder. Significant amount of urine soaking through surrounding dressing. - Will attempt to irrigate catheter to rule out blockage - If blocked, will need replacement - If not, may need larger gauge catheter  Elevated Transaminases. AST 196, ALT 557 on  admission. INR 1.34. No prior values available. No history of liver disease. On tylenol as needed at SNF. Unclear etiology. Differential includes medication induced toxicity (also on low dose atorvastatin as outpatient), infectious etiology, or metastatic disease.  - Check hepatitis panel - Check RUQ [place] - Check tylenol level - Trend CMP - Trend INR  CKD 4. Cr 2.2 on admission (baseline 1.8-2). Small increase likely due to dehydration. Patient also on ibuprofen at facility for chronic back pain. - IVF as above - Trend BMP - Avoid NSAIDs  CAD / HTN. S/p CABG in 1996. Myoview in 2015 with no high risk symptoms - Hold home ASA 81mg , atorvastatin 10mg  qhs while NPO - Hold home atenolol given soft BPs  HFrEF. Myoview in 2015 with EF of 31%. Currently with no signs of overload. - IVF as above  Skeletal Metastasis. Likely secondary to prostate cancer given elevated BPH. Family and patient have declined further work up, but would still like for patient to be full code and have declined palliative care/hospice during hospitalization last month.   Hypothyroidism. On synthroid 131mcg at home - Convert to IV synthroid 20mcg daily while NPO, resume oral formulation once taking PO  Chronic Back Pain. On oxycodone, prednisone 25mg  bid, ibuprofen, tylenol, lyrica as outpatient. - Hold home medications while NPO - Avoid NSAIDs given CKD and concern for upper GI bleed - Will avoid narcotics for now given soft BPs - Consider low dose fentanyl if patient needs something for pain before able to restart home medications.  - Need to ensure patient on PPI when restarting prednisone taper.   FEN/GI: NS @100cc /hr, NPO, Protonix Prophylaxis: SCDs given GI bleed  Disposition: Admit to  step down given acute GI bleed and soft BPs.   History of Present Illness:  James Small is a 82 y.o. male presenting with bright red blood per rectum. History is limited as patient only responds to yes/no  questions.  Patient is a resident of [place]. Staff there noticed that he had a BM with "significant" amount of bright red blood early this morning. Staff also reports that he had dark stool. Patient has a history of chronic abdominal pain and which he was complaining of earlier this morning, but denies any current abdominal pain. Per patient's MAR (with him in the room), he has been taking ibuprofen 200mg  up to 4 times day and has been on a prednisone taper. Patient has not been on a PPI or H2 blocker.   Denies fevers or chills. No chest pain or shortness of breath. Some nausea without vomiting.  In the ED, patient was noted to be hypothermic, tachycardic, and hypotensive. He received 500c of NS with improvement in his HR and BP.  Review Of Systems: Per HPI, Otherwise the remainder of the systems were negative.  Patient Active Problem List   Diagnosis Date Noted  . Osteopenia determined by x-ray 08/07/2015  . Lack of appetite 08/07/2015  . Indwelling catheter present on admission ([place]) 08/07/2015  . Sleepiness 08/07/2015  . Compression fracture of L1 lumbar vertebra ([place]) 08/07/2015  . CKD (chronic kidney disease), stage III   . Acute delirium   . Palliative care encounter   . Goals of care, counseling/discussion   . Bone metastases ([place])   . Intradural mass ([place])   . Back injury 07/24/2015  . Back pain 07/24/2015  . Back injuries   . Fall   . Renal insufficiency   . High risk social situation   . ESSENTIAL HYPERTENSION, BENIGN 12/06/2008  . Hypothyroidism 12/01/2008  . DYSLIPIDEMIA 12/01/2008  . Coronary atherosclerosis 12/01/2008    Past Medical History: Past Medical History  Diagnosis Date  . Inguinal hernia     left  . Thyroid disease   . Dyslipidemia   . BPH (benign prostatic hyperplasia)   . Coronary artery disease     status post cabg in 1996 by [place] [facility] with LIMA to the LAD, SVG to D1 and D2, SVG to circumflex, SVG to right coronary artery  . Hypertension    . Hypothyroidism   . Chronic kidney disease   . Presence of indwelling urinary catheter   . Weight loss     25 lbs over last few months per pt   . Arthritis     lower back - pt has epidural injections every few months   . Cancer [facility])     metasatic / spine     Past Surgical History: Past Surgical History  Procedure Laterality Date  . Coronary artery bypass graft      1996  . Cyst on necksurgery       Social History: Social History  Substance Use Topics  . Smoking status: Never Smoker   . Smokeless tobacco: Never Used  . Alcohol Use: No   Please also refer to relevant sections of EMR.  Family History: Family History  Problem Relation Age of Onset  . Family history unknown: Yes   Allergies and Medications: No Known Allergies No current facility-administered medications on file prior to encounter.   Current Outpatient Prescriptions on File Prior to Encounter  Medication Sig Dispense Refill  . acetaminophen (TYLENOL) 325 MG tablet Take 2 tablets (650 mg  total) by mouth every 6 (six) hours. (Patient taking differently: Take 650 mg by mouth 3 (three) times daily. ) 30 tablet 0  . aspirin EC 81 MG tablet Take 81 mg by mouth 2 (two) times daily.    Marland Kitchen atorvastatin (LIPITOR) 10 MG tablet Take 10 mg by mouth at bedtime.     . cetirizine (ZYRTEC) 10 MG tablet Take 10 mg by mouth daily.    Marland Kitchen ibuprofen (ADVIL,MOTRIN) 200 MG tablet Take 1 tablet (200 mg total) by mouth 4 (four) times daily. (Patient taking differently: Take 800 mg by mouth every 6 (six) hours as needed for mild pain. ) 30 tablet 0  . levothyroxine (SYNTHROID, LEVOTHROID) 150 MCG tablet Take 150 mcg by mouth daily before breakfast.    . lidocaine (LIDODERM) 5 % Place 1 patch onto the skin daily. Remove & Discard patch within 12 hours or as directed by MD 30 patch 0  . megestrol (MEGACE) 400 MG/10ML suspension Take 400 mg by mouth 2 (two) times daily.     Marland Kitchen atenolol (TENORMIN) 50 MG tablet TAKE 1/2 TABLET BY MOUTH  ONCE DAILY (Patient not taking: Reported on 08/16/2015) 195 tablet 1  . baclofen (LIORESAL) 10 MG tablet Take 0.5 tablets (5 mg total) by mouth 3 (three) times daily as needed for muscle spasms. (Patient not taking: Reported on 09/09/2015) 15 each 0  . Maltodextrin-Xanthan Gum (RESOURCE THICKENUP CLEAR) POWD Take 1 g by mouth as needed. (Patient not taking: Reported on 08/14/2015) 125 g 0  . pantoprazole (PROTONIX) 20 MG tablet Take 1 tablet (20 mg total) by mouth daily. (Patient not taking: Reported on 08/30/2015) 30 tablet 0  . predniSONE (DELTASONE) 50 MG tablet Continue taper with 50 mg BID through 6/22. (Patient not taking: Reported on 08/31/2015) 2 tablet 0  . pregabalin (LYRICA) 25 MG capsule Take 1 capsule (25 mg total) by mouth daily. (Patient not taking: Reported on 08/16/2015) 30 capsule 0    Objective: BP 87/61 mmHg  Pulse 99  Temp(Src) 94 F (34.4 C) (Axillary)  Resp 23  SpO2 99% Exam: General: Chronically ill appearing 82 year old man, lying in hospital bed Eyes: PERRL, EOMI ENTM: MMM, O/P clear Neck: FROM Cardiovascular: RRR, 2/6 systolic murmur noted Respiratory: NWOB, CTAB Abdomen: Suprapubic catheter in place, surrounding dressing soaked in urine, Soft, nontender, nondistended MSK: No edema Skin: Warm, dry Neuro: Alert. Responds to yes/no questions.  Psych: Blunted affect. Normal thought content.   Labs and Imaging: CBC BMET   Recent Labs Lab 08/30/2015 0216  WBC 29.0*  HGB 10.8*  HCT 33.3*  PLT 152    Recent Labs Lab 09/07/2015 0216  NA 140  K 4.7  CL 110  CO2 20*  BUN 69*  CREATININE 2.22*  GLUCOSE 146*  CALCIUM 8.0*     Lactic Acid 2.34  Urinalysis    Component Value Date/Time   COLORURINE AMBER* 08/29/2015 0358   APPEARANCEUR TURBID* 08/12/2015 0358   LABSPEC 1.020 09/04/2015 0358   PHURINE 8.5* 08/28/2015 0358   GLUCOSEU NEGATIVE 08/30/2015 0358   HGBUR MODERATE* 08/21/2015 0358   BILIRUBINUR NEGATIVE 09/05/2015 0358   KETONESUR NEGATIVE  08/27/2015 0358   PROTEINUR 100* 08/29/2015 0358   UROBILINOGEN 0.2 11/19/2014 2037   NITRITE NEGATIVE 09/01/2015 0358   LEUKOCYTESUR LARGE* 09/08/2015 0358   Ct Renal Stone Study  09/03/2015  CLINICAL DATA:  Abdominal pain, rectal bleeding, renal insufficiency. Known skeletal metastases. EXAM: CT ABDOMEN AND PELVIS WITHOUT CONTRAST TECHNIQUE: Multidetector CT imaging of the abdomen  and pelvis was performed following the standard protocol without IV contrast. COMPARISON:  MRI 07/24/2015 FINDINGS: Mild inflammatory stranding is present around the first and second portions of the duodenum. No extraluminal air. No bowel obstruction. There is moderate colonic diverticulosis. Bowel is otherwise remarkable only for extension of sigmoid colon into a large left inguinal hernia, continuing be on the lowest image of this study. There does not appear to be any obstruction or inflammation within the hernia, although it is not completely imaged. There are unremarkable unenhanced appearances of the liver, spleen, pancreas and adrenals. The left kidney is markedly atrophic. The right kidney is unremarkable. Ureters and urinary bladder are remarkable only for presence of a suprapubic urinary bladder catheter. The abdominal aorta is normal in caliber. There is mild atherosclerotic calcification. There is no adenopathy in the abdomen or pelvis. Multiple skeletal metastases are again evident, seen to better advantage on the recent MR examinations. This includes an expansile lesion of the left eighth rib which is not significantly changed. No acute findings are evident in the lower chest. Lung bases are clear except for minimal linear scarring or atelectasis. IMPRESSION: 1. Mild inflammatory stranding of the duodenal first and second portions. This could represent duodenitis or peptic ulcer disease. There is no extraluminal air. There is no bowel obstruction. 2. Marked left renal atrophy. 3. Large left inguinal hernia containing  a significant length of sigmoid colon, nonobstructed. The entire hernia is not included within the field of view of this study. 4. Uncomplicated colonic diverticulosis. 5. Multiple skeletal metastases. Electronically Signed   By: Andreas Newport M.D.   On: 08/31/2015 03:57   Vivi Barrack, MD 08/30/2015, 5:14 AM PGY-3, [place] Intern pager: [phone], text pages welcome

## 2015-08-13 NOTE — ED Notes (Signed)
Phlebotomy at bedside.

## 2015-08-13 NOTE — Progress Notes (Signed)
FPTS Interim Progress Note  S: Patient with persistent tachycardia, tachypnea, and hypotension. Also with increase in lactic acid from 2.3 at 2AM to 5 at 11AM. Hgb also continuing to drop from 10.8 at 2 AM to 8.6 around noon. Patient now with some confusion. Patient already on vanc and zosyn, and blood and urine cultures drawn prior to beginning antibiotics. S/p 3 boluses (1.5L). qSOFA score 3 (high risk).  Discussed patient's code status with daughter and son-in-law at bedside. Daughter reports patient wants aggressive treatment, but does not want to be "hooked up to monitors or have a feeding tube."   O: BP 74/63 mmHg  Pulse 107  Temp(Src) 97.5 F (36.4 C) (Oral)  Resp 32  Ht 5\' 8"  (1.727 m)  Wt 181 lb 10.5 oz (82.4 kg)  BMI 27.63 kg/m2  SpO2 99%  General: ill-appearing male lying in bed, moaning intermittently CV: tachycardic, no murmurs appreciated Pulm: diminished breath sounds bilaterally Abd: soft, non-tender, non-distended, +BS, suprapubic catheter in placed with no surrounding erythema or discharge Neuro: alert, not answering questions appropriately, confused  A/P: Given qSOFA score despite 3 boluses and broad-spectrum antibiotics, CCM consulted. Per CCM, persistently unstable vitals likely 2/2 hypovolemic shock due to GI bleed, though patient possibly also in early sepsis. Not appropriate for transfer to CCM at this time.  - Transfuse 2U PRBC - Continue vanc/zosyn - Palliative care consulted given conflicting reports of patient's code status vs his desires. Will see tomorrow (7/3).  - Continue IVF rehydration per CCM recs (currently receiving fourth bolus (2L total since admission))   Verner Mould, MD 08/12/2015, 3:16 PM PGY-2, [place] Medicine Service pager [phone]

## 2015-08-13 NOTE — Consult Note (Signed)
PULMONARY / CRITICAL CARE MEDICINE   Name: James Small MRN: [id number] DOB: [birth year]    ADMISSION DATE:  08/25/2015 CONSULTATION DATE:  08/18/2015   REFERRING MD:  [facility]-Dr. Avon Gully   CHIEF COMPLAINT:  Hypotension/SIRS   HISTORY OF PRESENT ILLNESS:   82 yo male with known skeletal metastasis with unknown primary but thought to be from prostate cancer. He was recently admitted after fall on 07/24/15 with L1 compression fracture. He was not considered surgical candidate. Recommended for decadron and steroid taper. He was found to have diffuse osseous metastasis and bilateral rib mets .  Presented to ER from rehab center today 09/04/2015  with bright red blood.  Blood pressure has been low. He has received 2 L of IVF w/ normal saline. Hbg has been trending down from 10.8 to 8.6 since arrival this morning at ~0200. He has been ordered 2 u PBRC that is currently pending.  He was seen by GI with thoughts this may be from diverticular region with possible upper GI source as welll . He was not felt to be a EGD candidate at this time. Recommended for Protonix drip .  Blood pressure has remained low , currently on NS at 75cc/hr . LA tr up 2.4 >5.1 . Cx pending .  He was started on empiric zosyn and vanc.  Pt has intermittent confusion . Family with daughter is at bedside . Discussion regarding his current condition .  He was seen by palliative care last hospital stay, he is a full code however daughter says he would not want to be in vegetative state kept alive.  PCCM consulted for assistance with ongoing hypotension and rising lactate.    PAST MEDICAL HISTORY :  He  has a past medical history of Inguinal hernia; Thyroid disease; Dyslipidemia; BPH (benign prostatic hyperplasia); Coronary artery disease; Hypertension; Hypothyroidism; Chronic kidney disease; Presence of indwelling urinary catheter; Weight loss; Arthritis; and Cancer ([place]).  PAST SURGICAL HISTORY: He  has past surgical history that  includes Coronary artery bypass graft and cyst on necksurgery .  No Known Allergies  No current facility-administered medications on file prior to encounter.   Current Outpatient Prescriptions on File Prior to Encounter  Medication Sig  . acetaminophen (TYLENOL) 325 MG tablet Take 2 tablets (650 mg total) by mouth every 6 (six) hours. (Patient taking differently: Take 650 mg by mouth 3 (three) times daily. )  . aspirin EC 81 MG tablet Take 81 mg by mouth 2 (two) times daily.  Marland Kitchen atorvastatin (LIPITOR) 10 MG tablet Take 10 mg by mouth at bedtime.   . cetirizine (ZYRTEC) 10 MG tablet Take 10 mg by mouth daily.  Marland Kitchen ibuprofen (ADVIL,MOTRIN) 200 MG tablet Take 1 tablet (200 mg total) by mouth 4 (four) times daily. (Patient taking differently: Take 800 mg by mouth every 6 (six) hours as needed for mild pain. )  . levothyroxine (SYNTHROID, LEVOTHROID) 150 MCG tablet Take 150 mcg by mouth daily before breakfast.  . lidocaine (LIDODERM) 5 % Place 1 patch onto the skin daily. Remove & Discard patch within 12 hours or as directed by MD  . megestrol (MEGACE) 400 MG/10ML suspension Take 400 mg by mouth 2 (two) times daily.   Marland Kitchen atenolol (TENORMIN) 50 MG tablet TAKE 1/2 TABLET BY MOUTH ONCE DAILY (Patient not taking: Reported on 08/30/2015)  . baclofen (LIORESAL) 10 MG tablet Take 0.5 tablets (5 mg total) by mouth 3 (three) times daily as needed for muscle spasms. (Patient not taking: Reported on 08/19/2015)  .  Maltodextrin-Xanthan Gum (RESOURCE THICKENUP CLEAR) POWD Take 1 g by mouth as needed. (Patient not taking: Reported on 08/21/2015)  . pantoprazole (PROTONIX) 20 MG tablet Take 1 tablet (20 mg total) by mouth daily. (Patient not taking: Reported on 08/31/2015)  . predniSONE (DELTASONE) 50 MG tablet Continue taper with 50 mg BID through 6/22. (Patient not taking: Reported on 09/02/2015)  . pregabalin (LYRICA) 25 MG capsule Take 1 capsule (25 mg total) by mouth daily. (Patient not taking: Reported on 08/23/2015)     FAMILY HISTORY:  His has no family status information on file.   SOCIAL HISTORY: He  reports that he has never smoked. He has never used smokeless tobacco. He reports that he does not drink alcohol or use illicit drugs.  REVIEW OF SYSTEMS:   Pt is confused and hard of hearing . Info from chart and daughter  SUBJECTIVE:  Hypotension/GIB   VITAL SIGNS: BP 81/50 mmHg  Pulse 108  Temp(Src) 97.5 F (36.4 C) (Oral)  Resp 32  Ht 5\' 8"  (1.727 m)  Wt 181 lb 10.5 oz (82.4 kg)  BMI 27.63 kg/m2  SpO2 98%  HEMODYNAMICS:    VENTILATOR SETTINGS:    INTAKE / OUTPUT: I/O last 3 completed shifts: In: 1000 [I.V.:1000] Out: 150 [Urine:150]  PHYSICAL EXAMINATION: General: very frail , pale elderly male  Neuro:  Alert, intermittent confusion  HEENT: very dry mucosa ,  Cardiovascular:  ST , no m/r/g/  Lungs: Decreased BS in bases  Abdomen:  Soft, NT , BS +  Musculoskeletal:  Intact  Skin:  intact  LABS:  BMET  Recent Labs Lab 08/23/2015 0216  NA 140  K 4.7  CL 110  CO2 20*  BUN 69*  CREATININE 2.22*  GLUCOSE 146*    Electrolytes  Recent Labs Lab 09/02/2015 0216  CALCIUM 8.0*    CBC  Recent Labs Lab 08/24/2015 0216 09/02/2015 0744 08/30/2015 1147  WBC 29.0*  --   --   HGB 10.8* 9.3* 8.6*  HCT 33.3*  --   --   PLT 152  --   --     Coag's  Recent Labs Lab 09/03/2015 0216  INR 1.34    Sepsis Markers  Recent Labs Lab 08/14/2015 0249 08/12/2015 0744 09/02/2015 1147  LATICACIDVEN 2.34* 2.4* 5.1*    ABG No results for input(s): PHART, PCO2ART, PO2ART in the last 168 hours.  Liver Enzymes  Recent Labs Lab 08/12/2015 0216  AST 196*  ALT 557*  ALKPHOS 131*  BILITOT 0.7  ALBUMIN 2.5*    Cardiac Enzymes No results for input(s): TROPONINI, PROBNP in the last 168 hours.  Glucose No results for input(s): GLUCAP in the last 168 hours.  Imaging Dg Chest 1 View  08/22/2015  CLINICAL DATA:  Tachypnea. EXAM: CHEST 1 VIEW COMPARISON:  10/29/2011 FINDINGS:  Shallow inspiration. Minor atelectatic appearing linear basilar opacities, accentuated due to the shallow degree of inspiration. No airspace consolidation. No large effusion. Normal pulmonary vasculature. IMPRESSION: Shallow inspiration.  No consolidation or large effusion. Electronically Signed   By: Andreas Newport M.D.   On: 09/04/2015 06:57   Ct Renal Stone Study  08/22/2015  CLINICAL DATA:  Abdominal pain, rectal bleeding, renal insufficiency. Known skeletal metastases. EXAM: CT ABDOMEN AND PELVIS WITHOUT CONTRAST TECHNIQUE: Multidetector CT imaging of the abdomen and pelvis was performed following the standard protocol without IV contrast. COMPARISON:  MRI 07/24/2015 FINDINGS: Mild inflammatory stranding is present around the first and second portions of the duodenum. No extraluminal air. No bowel obstruction.  There is moderate colonic diverticulosis. Bowel is otherwise remarkable only for extension of sigmoid colon into a large left inguinal hernia, continuing be on the lowest image of this study. There does not appear to be any obstruction or inflammation within the hernia, although it is not completely imaged. There are unremarkable unenhanced appearances of the liver, spleen, pancreas and adrenals. The left kidney is markedly atrophic. The right kidney is unremarkable. Ureters and urinary bladder are remarkable only for presence of a suprapubic urinary bladder catheter. The abdominal aorta is normal in caliber. There is mild atherosclerotic calcification. There is no adenopathy in the abdomen or pelvis. Multiple skeletal metastases are again evident, seen to better advantage on the recent MR examinations. This includes an expansile lesion of the left eighth rib which is not significantly changed. No acute findings are evident in the lower chest. Lung bases are clear except for minimal linear scarring or atelectasis. IMPRESSION: 1. Mild inflammatory stranding of the duodenal first and second portions.  This could represent duodenitis or peptic ulcer disease. There is no extraluminal air. There is no bowel obstruction. 2. Marked left renal atrophy. 3. Large left inguinal hernia containing a significant length of sigmoid colon, nonobstructed. The entire hernia is not included within the field of view of this study. 4. Uncomplicated colonic diverticulosis. 5. Multiple skeletal metastases. Electronically Signed   By: Andreas Newport M.D.   On: 09/04/2015 03:57   US Abdomen Limited Ruq  08/23/2015  CLINICAL DATA:  Rectal bleeding. Abnormal liver function tests. Abdominal pain for 1 day. EXAM: US ABDOMEN LIMITED - RIGHT UPPER QUADRANT COMPARISON:  None. FINDINGS: Gallbladder: No gallstones or wall thickening visualized. No sonographic Murphy sign noted by sonographer. Common bile duct: Diameter: 3.6 mm Liver: No focal lesion identified. Within normal limits in parenchymal echogenicity. IMPRESSION: Normal liver, gallbladder and bile ducts. Electronically Signed   By: Andreas Newport M.D.   On: 09/02/2015 06:55     STUDIES:  MRII   CULTURES: BC x 2 7/2 >> UC 7/2 >>  ANTIBIOTICS:   SIGNIFICANT EVENTS: Readmitted 7/2 with GIB , hypotension   LINES/TUBES:   DISCUSSION: 82 yo male with skeletal mets from unknown primary -most likely prostate with recent admission with L1 fx readmitted 7/2 with GIB and hypotension .   ASSESSMENT / PLAN:  PULMONARY A:  P:   At risk for intubation  o2 goal >90% Check cxr in am   CARDIOVASCULAR A:  Hypotension  SIRS  CAD with myoview in 2015 with EF at 31%.  P:  Fluid challenge with 2 L bolus-pt appears very dry .  Monitor for signs of fluid overload.   RENAL A:   Acute on Chronic Renal Failure  Lactic Acidosis  Chronic indwelling suprapubic cath  P:   Fluid resuciatation  Replace electrolyte  Tr lactate   GASTROINTESTINAL A:   GIB possible diverticular with element of upper GI  Transaminitis ? Shock liver vs possible metastatic dz   -INR ok  P:   GI following  Cont PPI    HEMATOLOGIC A:   Anemia -from GIB  P:  Awaiting 2 u PRBC  Tr cbc   INFECTIOUS A:   SIRS ? Source  P:   Follow cx data  Zosyn /Vanc day#1  Trend wbc/temp   ENDOCRINE A:   Hypothyroid     P:   Synthroid   NEUROLOGIC A:   AMS possible secondary to hypotension , pain meds.  Chronic back pain with skeletal mets P:  Pain meds As needed   Limit sedation as able.    FAMILY  - Updates: Daughter updated at bedside 7/2   - Inter-disciplinary family meet or Palliative Care meeting due by:  7/9   Rexene Edison NP-C  Pulmonary and [place] Pager: [phone]  09/11/2015, 2:47 PM

## 2015-08-13 NOTE — Progress Notes (Signed)
eLink Physician-Brief Progress Note Patient Name: James Small DOB: [birth year] MRN: [id number]   Date of Service  09/08/2015  HPI/Events of Note  Hypotension - BP = 85/58. Last LVEF = 31% by nuclear perfusion scan. Repeat Hgb being drawn now.   eICU Interventions  Will order: 1. Bolus with 0.9 NaCl 500 mL IV over 30 minutes now.  2. Await repeat Hgb.     Intervention Category Major Interventions: Hypotension - evaluation and management  Sommer,Steven Eugene 09/02/2015, 11:26 PM

## 2015-08-13 NOTE — Telephone Encounter (Signed)
Received call from [facility] again about patient. Said that he had a dark bowel movement mixed with a "significant" amount of blood. Nurse said that his last set of vitals were normal. Patient had no previously recorded episodes of bloody BMs. Patient had complained of abdominal pain earlier in the day but none currently. Per chart review patient is prescribed ibuprofen 200mg  PO four times daily.  Given that it would take at least a day or more for lab work to return, I instructed the nurse to send the patient to the ED so that he could have labs drawn to make sure his hemoglobin was stable.  Algis Greenhouse. Jerline Pain, [facility] Resident PGY-3 09/08/2015 12:52 AM

## 2015-08-13 NOTE — ED Notes (Signed)
Pt on phone with daughter at this time  

## 2015-08-13 NOTE — ED Notes (Addendum)
Dr. Mingo Amber at bedside and aware of critical Lactic Acid 2.4 and low BP. IV team unable to obtain large bore IV, 22g obtained per documentation.

## 2015-08-13 NOTE — Consult Note (Signed)
Referring Provider: Dr. Mingo Amber Primary Care Physician:  Limmie Patricia, MD Primary Gastroenterologist:  Althia Forts  Reason for Consultation:  GI bleed  HPI: James Small is a 82 y.o. male with multiple medical problems being seen for a consult due to rectal bleeding described as bright red blood per rectum yesterday at the nursing home. Black stools also reported. No history of N/V/hematemesis/abdominal pain. Daughter at bedside. Patient unable to give me any history. Reportedly has skeletal metastasis with unknown primary. Patient is tachypneic, tachycardic, hypotensive, and hypothermic. No known history of GI bleeding. Hgb 10.8 and now 9.3. Has been on NSAIDs and prednisone. Noncontrast CT showed mild inflammation of the first and second portion of the duodenum and skeletal mets (refer to complete report for additional findings).  Past Medical History  Diagnosis Date  . Inguinal hernia     left  . Thyroid disease   . Dyslipidemia   . BPH (benign prostatic hyperplasia)   . Coronary artery disease     status post cabg in 1996 by [place] [facility] with LIMA to the LAD, SVG to D1 and D2, SVG to circumflex, SVG to right coronary artery  . Hypertension   . Hypothyroidism   . Chronic kidney disease   . Presence of indwelling urinary catheter   . Weight loss     25 lbs over last few months per pt   . Arthritis     lower back - pt has epidural injections every few months   . Cancer [facility])     metasatic / spine     Past Surgical History  Procedure Laterality Date  . Coronary artery bypass graft      1996  . Cyst on necksurgery       Prior to Admission medications   Medication Sig Start Date End Date Taking? Authorizing Provider  acetaminophen (TYLENOL) 325 MG tablet Take 2 tablets (650 mg total) by mouth every 6 (six) hours. Patient taking differently: Take 650 mg by mouth 3 (three) times daily.  08/01/15  Yes Hillary Corinda Gubler, MD  aspirin EC 81 MG tablet Take 81 mg by mouth 2  (two) times daily.   Yes Historical Provider, MD  atenolol (TENORMIN) 25 MG tablet Take 25 mg by mouth daily.   Yes Historical Provider, MD  atorvastatin (LIPITOR) 10 MG tablet Take 10 mg by mouth at bedtime.    Yes Historical Provider, MD  bisacodyl (DULCOLAX) 10 MG suppository Place 10 mg rectally as needed for moderate constipation.   Yes Historical Provider, MD  calcitonin, salmon, (MIACALCIN/FORTICAL) 200 UNIT/ACT nasal spray Place 1 spray into alternate nostrils daily.   Yes Historical Provider, MD  cetirizine (ZYRTEC) 10 MG tablet Take 10 mg by mouth daily.   Yes Historical Provider, MD  ibuprofen (ADVIL,MOTRIN) 200 MG tablet Take 1 tablet (200 mg total) by mouth 4 (four) times daily. Patient taking differently: Take 800 mg by mouth every 6 (six) hours as needed for mild pain.  08/01/15  Yes Hillary Corinda Gubler, MD  levothyroxine (SYNTHROID, LEVOTHROID) 150 MCG tablet Take 150 mcg by mouth daily before breakfast.   Yes Historical Provider, MD  lidocaine (LIDODERM) 5 % Place 1 patch onto the skin daily. Remove & Discard patch within 12 hours or as directed by MD 08/01/15  Yes Rogue Bussing, MD  magnesium hydroxide (MILK OF MAGNESIA) 400 MG/5ML suspension Take 30 mLs by mouth daily as needed for mild constipation.   Yes Historical Provider, MD  megestrol (MEGACE) 400 MG/10ML suspension  Take 400 mg by mouth 2 (two) times daily.    Yes Historical Provider, MD  Multiple Vitamin (MULTIVITAMIN WITH MINERALS) TABS tablet Take 1 tablet by mouth daily.   Yes Historical Provider, MD  oxyCODONE (OXY IR/ROXICODONE) 5 MG immediate release tablet Take 2.5 mg by mouth every 4 (four) hours as needed for severe pain.   Yes Historical Provider, MD  oxyCODONE (OXY IR/ROXICODONE) 5 MG immediate release tablet Take 2.5 mg by mouth See admin instructions. Prior to physical therapy   Yes Historical Provider, MD  predniSONE (DELTASONE) 5 MG tablet Take 25 mg by mouth 2 (two) times daily with a meal.   Yes  Historical Provider, MD  Sodium Phosphates (RA SALINE ENEMA RE) Place 1 each rectally as needed (for constipation).   Yes Historical Provider, MD  UNABLE TO FIND Take 120 mLs by mouth 2 (two) times daily. Med Name: Med Pass   Yes Historical Provider, MD  UNABLE TO FIND Take 1 each by mouth 2 (two) times daily. Med Name: Magic Cup   Yes Historical Provider, MD  atenolol (TENORMIN) 50 MG tablet TAKE 1/2 TABLET BY MOUTH ONCE DAILY Patient not taking: Reported on 08/23/2015 07/24/15   Minus Breeding, MD  baclofen (LIORESAL) 10 MG tablet Take 0.5 tablets (5 mg total) by mouth 3 (three) times daily as needed for muscle spasms. Patient not taking: Reported on 09/03/2015 08/01/15   Rogue Bussing, MD  Maltodextrin-Xanthan Gum [facility] CLEAR) POWD Take 1 g by mouth as needed. Patient not taking: Reported on 09/06/2015 08/01/15   Rogue Bussing, MD  pantoprazole (PROTONIX) 20 MG tablet Take 1 tablet (20 mg total) by mouth daily. Patient not taking: Reported on 08/14/2015 08/01/15   Rogue Bussing, MD  predniSONE (DELTASONE) 50 MG tablet Continue taper with 50 mg BID through 6/22. Patient not taking: Reported on 09/03/2015 08/01/15   Rogue Bussing, MD  pregabalin (LYRICA) 25 MG capsule Take 1 capsule (25 mg total) by mouth daily. Patient not taking: Reported on 08/29/2015 08/01/15   Rogue Bussing, MD    Scheduled Meds: . levothyroxine  75 mcg Intravenous Daily  . lidocaine  1 patch Transdermal Q24H  . [START ON 08/16/2015] pantoprazole  40 mg Intravenous Q12H  . sodium chloride flush  3 mL Intravenous Q12H   Continuous Infusions: . sodium chloride 75 mL/hr at 08/18/2015 0658  . pantoprozole (PROTONIX) infusion 8 mg/hr (09/03/2015 0658)  . piperacillin-tazobactam (ZOSYN)  IV    . [START ON 08/14/2015] vancomycin     PRN Meds:.  Allergies as of 09/09/2015  . (No Known Allergies)    Family History  Problem Relation Age of Onset  . Family history unknown: Yes     Social History   Social History  . Marital Status: Married    Spouse Name: N/A  . Number of Children: N/A  . Years of Education: N/A   Occupational History  . Not on file.   Social History Main Topics  . Smoking status: Never Smoker   . Smokeless tobacco: Never Used  . Alcohol Use: No  . Drug Use: No  . Sexual Activity: Not on file   Other Topics Concern  . Not on file   Social History Narrative    Review of Systems: All negative except as stated above in HPI.  Physical Exam: Vital signs: Filed Vitals:   08/31/2015 1045 08/20/2015 1130  BP: 79/69 84/40  Pulse: 116 102  Temp:  94  Resp: 24 30  General:  Elderly, frail, +acute distress, demented HEENT: anicteric sclera Neck: supple, nontender Lungs:  Coarse breath sounds Heart:  Regular rate and rhythm; no murmurs, clicks, rubs,  or gallops. Abdomen: diffuse tenderness with guarding, soft, nondistended, +BS  Rectal:  Deferred Ext: no edema  GI:  Lab Results:  Recent Labs  09/04/2015 0216 08/14/2015 0744  WBC 29.0*  --   HGB 10.8* 9.3*  HCT 33.3*  --   PLT 152  --    BMET  Recent Labs  08/20/2015 0216  NA 140  K 4.7  CL 110  CO2 20*  GLUCOSE 146*  BUN 69*  CREATININE 2.22*  CALCIUM 8.0*   LFT  Recent Labs  09/04/2015 0216  PROT 5.5*  ALBUMIN 2.5*  AST 196*  ALT 557*  ALKPHOS 131*  BILITOT 0.7   PT/INR  Recent Labs  09/06/2015 0216  LABPROT 16.7*  INR 1.34     Studies/Results: Dg Chest 1 View  09/08/2015  CLINICAL DATA:  Tachypnea. EXAM: CHEST 1 VIEW COMPARISON:  10/29/2011 FINDINGS: Shallow inspiration. Minor atelectatic appearing linear basilar opacities, accentuated due to the shallow degree of inspiration. No airspace consolidation. No large effusion. Normal pulmonary vasculature. IMPRESSION: Shallow inspiration.  No consolidation or large effusion. Electronically Signed   By: Andreas Newport M.D.   On: 08/19/2015 06:57   Ct Renal Stone Study  08/17/2015  CLINICAL DATA:   Abdominal pain, rectal bleeding, renal insufficiency. Known skeletal metastases. EXAM: CT ABDOMEN AND PELVIS WITHOUT CONTRAST TECHNIQUE: Multidetector CT imaging of the abdomen and pelvis was performed following the standard protocol without IV contrast. COMPARISON:  MRI 07/24/2015 FINDINGS: Mild inflammatory stranding is present around the first and second portions of the duodenum. No extraluminal air. No bowel obstruction. There is moderate colonic diverticulosis. Bowel is otherwise remarkable only for extension of sigmoid colon into a large left inguinal hernia, continuing be on the lowest image of this study. There does not appear to be any obstruction or inflammation within the hernia, although it is not completely imaged. There are unremarkable unenhanced appearances of the liver, spleen, pancreas and adrenals. The left kidney is markedly atrophic. The right kidney is unremarkable. Ureters and urinary bladder are remarkable only for presence of a suprapubic urinary bladder catheter. The abdominal aorta is normal in caliber. There is mild atherosclerotic calcification. There is no adenopathy in the abdomen or pelvis. Multiple skeletal metastases are again evident, seen to better advantage on the recent MR examinations. This includes an expansile lesion of the left eighth rib which is not significantly changed. No acute findings are evident in the lower chest. Lung bases are clear except for minimal linear scarring or atelectasis. IMPRESSION: 1. Mild inflammatory stranding of the duodenal first and second portions. This could represent duodenitis or peptic ulcer disease. There is no extraluminal air. There is no bowel obstruction. 2. Marked left renal atrophy. 3. Large left inguinal hernia containing a significant length of sigmoid colon, nonobstructed. The entire hernia is not included within the field of view of this study. 4. Uncomplicated colonic diverticulosis. 5. Multiple skeletal metastases.  Electronically Signed   By: Andreas Newport M.D.   On: 08/14/2015 03:57   US Abdomen Limited Ruq  09/09/2015  CLINICAL DATA:  Rectal bleeding. Abnormal liver function tests. Abdominal pain for 1 day. EXAM: US ABDOMEN LIMITED - RIGHT UPPER QUADRANT COMPARISON:  None. FINDINGS: Gallbladder: No gallstones or wall thickening visualized. No sonographic Murphy sign noted by sonographer. Common bile duct: Diameter: 3.6 mm Liver: No  focal lesion identified. Within normal limits in parenchymal echogenicity. IMPRESSION: Normal liver, gallbladder and bile ducts. Electronically Signed   By: Andreas Newport M.D.   On: 08/26/2015 06:55    Impression/Plan: 82 yo with acute onset of rectal bleeding that I think is lower in origin (diverticular) although an upper source is still possible. I do not think he could undergo an EGD without being intubated due to his current hemodyamics and would favor a conservative approach with Protonix drip; IVFs; blood transfusions prn; Keep NPO. Supportive care. Hold off on any invasive GI procedures. [facility] will follow along.    LOS: 0 days   [place]ella[place] C.  08/23/2015, 12:05 PM  Pager [phone]  If no answer or after 5 PM call [phone]

## 2015-08-13 NOTE — Progress Notes (Signed)
Pharmacy Antibiotic Note  James Small is a 82 y.o. male admitted on 08/22/2015 with SIRS.  Pharmacy has been consulted for Vancocin and Zosyn dosing.  Plan: Vancomycin 1000mg  IV every 24 hours.  Goal trough 15-20 mcg/mL. Zosyn 3.375g IV q8h (4 hour infusion).  Height: 5\' 8"  (172.7 cm) Weight: 181 lb 10.5 oz (82.4 kg) IBW/kg (Calculated) : 68.4  Temp (24hrs), Avg:94 F (34.4 C), Min:94 F (34.4 C), Max:94 F (34.4 C)   Recent Labs Lab 08/17/2015 0216 08/21/2015 0249  WBC 29.0*  --   CREATININE 2.22*  --   LATICACIDVEN  --  2.34*    Estimated Creatinine Clearance: 22.7 mL/min (by C-G formula based on Cr of 2.22).    No Known Allergies   Thank you for allowing pharmacy to be a part of this patient's care.  Wynona Neat, PharmD, BCPS  08/14/2015 6:12 AM

## 2015-08-13 NOTE — ED Notes (Signed)
Attempted secondary IV access, unsuccessful.

## 2015-08-13 NOTE — ED Notes (Signed)
Dr. Mingo Amber paged a third time to notify of low BP's, and for requests for Lidocaine patch and nausea medication per family.

## 2015-08-13 NOTE — ED Notes (Signed)
Attempted report 

## 2015-08-13 NOTE — Consult Note (Signed)
WOC wound consult note Reason for Consult: Incontinence associated dermatitis (IAD), assess peri-tube POC for suprapubic catheter Wound type: Moisture Associated Skin Damage, specifically incontinence associated dermatitis (IAD) Pressure Ulcer POA: No Measurement: Mirror image partial thickness tissue loss in the parianal area secondary to fecal incontinence with blood measuring 5cm x 3cm on each side Wound bed: Red, moist Drainage (amount, consistency, odor) scant serous, blood on washcloth after cleansing Periwound: intact, dry, pale Dressing procedure/placement/frequency: I have implemented a POC for the integumentary system with bilateral pressure redistribution boots for the heels, side to side positioning and avoidance of the supine position, Q4H and PRN skin care using a pH balanced, no-rinse skin cleanser and our house moisture barrier ointment to the perianal area of skin breakdown and guidance for peritube skin care. [facility] nursing team will not follow, but will remain available to this patient, the nursing and medical teams.  Please re-consult if needed. Thanks, Maudie Flakes, MSN, RN, [place], Arther Abbott  Pager# [phone]

## 2015-08-13 NOTE — Progress Notes (Signed)
Pt SBPs in 70s, rechecked 85/58.  Elink notified, bolus and labs ordered.  Spoke with MD about the possibility of central line, pressors, and ICU placement if unable to maintain blood pressures.  Will continue to monitor.

## 2015-08-13 NOTE — ED Provider Notes (Signed)
CSN: [id number]     Arrival date & time 09/05/2015  0141 History  By signing my name below, I, Altamease Oiler, attest that this documentation has been prepared under the direction and in the presence of Tanna Furry, MD. Electronically Signed: Altamease Oiler, ED Scribe. 08/27/2015. 5:42 AM    Chief Complaint  Patient presents with  . Rectal Bleeding   The history is provided by the patient and the EMS personnel. No language interpreter was used.   Brought in by EMS from [facility] and [place], James Small is a 82 y.o. male with PMHx of metastatic cancer, CAD, HTN, and CKD who presents to the Emergency Department complaining of hematochezia noted by the nurses at his facility tonight. The pt did not notice any blood and denies any history of blood per rectum.  Associated symptoms include mild dizziness. Pt denies abdominal pain but states that he hurts "all over".    Past Medical History  Diagnosis Date  . Inguinal hernia     left  . Thyroid disease   . Dyslipidemia   . BPH (benign prostatic hyperplasia)   . Coronary artery disease     status post cabg in 1996 by [facility] with LIMA to the LAD, SVG to D1 and D2, SVG to circumflex, SVG to right coronary artery  . Hypertension   . Hypothyroidism   . Chronic kidney disease   . Presence of indwelling urinary catheter   . Weight loss     25 lbs over last few months per pt   . Arthritis     lower back - pt has epidural injections every few months   . Cancer [facility])     metasatic / spine    Past Surgical History  Procedure Laterality Date  . Coronary artery bypass graft      1996  . Cyst on necksurgery      Family History  Problem Relation Age of Onset  . Family history unknown: Yes   Social History  Substance Use Topics  . Smoking status: Never Smoker   . Smokeless tobacco: Never Used  . Alcohol Use: No    Review of Systems  Constitutional: Negative for fever, chills, diaphoresis, appetite change and fatigue.  HENT:  Negative for mouth sores, sore throat and trouble swallowing.   Eyes: Negative for visual disturbance.  Respiratory: Negative for cough, chest tightness, shortness of breath and wheezing.   Cardiovascular: Negative for chest pain.  Gastrointestinal: Positive for blood in stool and hematochezia. Negative for nausea, vomiting, abdominal pain, diarrhea and abdominal distention.  Endocrine: Negative for polydipsia, polyphagia and polyuria.  Genitourinary: Negative for dysuria, frequency and hematuria.  Musculoskeletal: Positive for myalgias. Negative for gait problem.  Skin: Negative for color change, pallor and rash.  Neurological: Positive for dizziness. Negative for syncope, light-headedness and headaches.  Hematological: Does not bruise/bleed easily.  Psychiatric/Behavioral: Negative for behavioral problems and confusion.   Allergies  Review of patient's allergies indicates no known allergies.  Home Medications   Prior to Admission medications   Medication Sig Start Date End Date Taking? Authorizing Provider  acetaminophen (TYLENOL) 325 MG tablet Take 2 tablets (650 mg total) by mouth every 6 (six) hours. Patient taking differently: Take 650 mg by mouth 3 (three) times daily.  08/01/15  Yes Hillary Corinda Gubler, MD  aspirin EC 81 MG tablet Take 81 mg by mouth 2 (two) times daily.   Yes Historical Provider, MD  atenolol (TENORMIN) 25 MG tablet Take 25 mg  by mouth daily.   Yes Historical Provider, MD  atorvastatin (LIPITOR) 10 MG tablet Take 10 mg by mouth at bedtime.    Yes Historical Provider, MD  bisacodyl (DULCOLAX) 10 MG suppository Place 10 mg rectally as needed for moderate constipation.   Yes Historical Provider, MD  calcitonin, salmon, (MIACALCIN/FORTICAL) 200 UNIT/ACT nasal spray Place 1 spray into alternate nostrils daily.   Yes Historical Provider, MD  cetirizine (ZYRTEC) 10 MG tablet Take 10 mg by mouth daily.   Yes Historical Provider, MD  ibuprofen (ADVIL,MOTRIN) 200 MG  tablet Take 1 tablet (200 mg total) by mouth 4 (four) times daily. Patient taking differently: Take 800 mg by mouth every 6 (six) hours as needed for mild pain.  08/01/15  Yes Hillary Corinda Gubler, MD  levothyroxine (SYNTHROID, LEVOTHROID) 150 MCG tablet Take 150 mcg by mouth daily before breakfast.   Yes Historical Provider, MD  lidocaine (LIDODERM) 5 % Place 1 patch onto the skin daily. Remove & Discard patch within 12 hours or as directed by MD 08/01/15  Yes Rogue Bussing, MD  magnesium hydroxide (MILK OF MAGNESIA) 400 MG/5ML suspension Take 30 mLs by mouth daily as needed for mild constipation.   Yes Historical Provider, MD  megestrol (MEGACE) 400 MG/10ML suspension Take 400 mg by mouth 2 (two) times daily.    Yes Historical Provider, MD  Multiple Vitamin (MULTIVITAMIN WITH MINERALS) TABS tablet Take 1 tablet by mouth daily.   Yes Historical Provider, MD  oxyCODONE (OXY IR/ROXICODONE) 5 MG immediate release tablet Take 2.5 mg by mouth every 4 (four) hours as needed for severe pain.   Yes Historical Provider, MD  oxyCODONE (OXY IR/ROXICODONE) 5 MG immediate release tablet Take 2.5 mg by mouth See admin instructions. Prior to physical therapy   Yes Historical Provider, MD  predniSONE (DELTASONE) 5 MG tablet Take 25 mg by mouth 2 (two) times daily with a meal.   Yes Historical Provider, MD  Sodium Phosphates (RA SALINE ENEMA RE) Place 1 each rectally as needed (for constipation).   Yes Historical Provider, MD  UNABLE TO FIND Take 120 mLs by mouth 2 (two) times daily. Med Name: Med Pass   Yes Historical Provider, MD  UNABLE TO FIND Take 1 each by mouth 2 (two) times daily. Med Name: Magic Cup   Yes Historical Provider, MD  atenolol (TENORMIN) 50 MG tablet TAKE 1/2 TABLET BY MOUTH ONCE DAILY Patient not taking: Reported on 08/27/2015 07/24/15   Minus Breeding, MD  baclofen (LIORESAL) 10 MG tablet Take 0.5 tablets (5 mg total) by mouth 3 (three) times daily as needed for muscle spasms. Patient  not taking: Reported on 09/02/2015 08/01/15   Rogue Bussing, MD  Maltodextrin-Xanthan Gum [facility] CLEAR) POWD Take 1 g by mouth as needed. Patient not taking: Reported on 08/30/2015 08/01/15   Rogue Bussing, MD  pantoprazole (PROTONIX) 20 MG tablet Take 1 tablet (20 mg total) by mouth daily. Patient not taking: Reported on 08/12/2015 08/01/15   Rogue Bussing, MD  predniSONE (DELTASONE) 50 MG tablet Continue taper with 50 mg BID through 6/22. Patient not taking: Reported on 08/14/2015 08/01/15   Rogue Bussing, MD  pregabalin (LYRICA) 25 MG capsule Take 1 capsule (25 mg total) by mouth daily. Patient not taking: Reported on 08/26/2015 08/01/15   Rogue Bussing, MD   BP 82/60 mmHg  Pulse 104  Temp(Src) 94 F (34.4 C) (Axillary)  Resp 20  SpO2 97% Physical Exam  Constitutional: He is oriented  to person, place, and time. He appears well-developed and well-nourished. No distress.  HENT:  Head: Normocephalic.  Eyes: Conjunctivae are normal. Pupils are equal, round, and reactive to light. No scleral icterus.  Neck: Normal range of motion. Neck supple. No thyromegaly present.  Cardiovascular: Normal rate and regular rhythm.  Exam reveals no gallop and no friction rub.   No murmur heard. Pulmonary/Chest: Effort normal and breath sounds normal. No respiratory distress. He has no wheezes. He has no rales.  Abdominal: Soft. He exhibits no distension. There is no tenderness. There is no rebound.  No apparent tenderness Hypoactive bowel sounds  Genitourinary:  Bright red blood noted on rectal exam  Musculoskeletal: Normal range of motion.  Neurological: He is alert and oriented to person, place, and time.  Skin: Skin is warm and dry. No rash noted.  Psychiatric: He has a normal mood and affect. His behavior is normal.    ED Course  Procedures (including critical care time) DIAGNOSTIC STUDIES: Oxygen Saturation is 97% on RA,  normal by my  interpretation.    COORDINATION OF CARE: 2:32 AM Discussed treatment plan which includes lab work, CT renal stone study, IVF, and possible blood transfusion with pt at bedside and pt agreed to plan.  Labs Review Labs Reviewed  COMPREHENSIVE METABOLIC PANEL - Abnormal; Notable for the following:    CO2 20 (*)    Glucose, Bld 146 (*)    BUN 69 (*)    Creatinine, Ser 2.22 (*)    Calcium 8.0 (*)    Total Protein 5.5 (*)    Albumin 2.5 (*)    AST 196 (*)    ALT 557 (*)    Alkaline Phosphatase 131 (*)    GFR calc non Af Amer 24 (*)    GFR calc Af Amer 28 (*)    All other components within normal limits  CBC - Abnormal; Notable for the following:    WBC 29.0 (*)    RBC 3.53 (*)    Hemoglobin 10.8 (*)    HCT 33.3 (*)    All other components within normal limits  PROTIME-INR - Abnormal; Notable for the following:    Prothrombin Time 16.7 (*)    All other components within normal limits  AMMONIA - Abnormal; Notable for the following:    Ammonia 60 (*)    All other components within normal limits  URINALYSIS, ROUTINE W REFLEX MICROSCOPIC (NOT AT [facility]) - Abnormal; Notable for the following:    Color, Urine AMBER (*)    APPearance TURBID (*)    pH 8.5 (*)    Hgb urine dipstick MODERATE (*)    Protein, ur 100 (*)    Leukocytes, UA LARGE (*)    All other components within normal limits  URINE MICROSCOPIC-ADD ON - Abnormal; Notable for the following:    Bacteria, UA MANY (*)    Casts HYALINE CASTS (*)    Crystals TRIPLE PHOSPHATE CRYSTALS (*)    All other components within normal limits  I-STAT CG4 LACTIC ACID, ED - Abnormal; Notable for the following:    Lactic Acid, Venous 2.34 (*)    All other components within normal limits  URINE CULTURE  CULTURE, BLOOD (ROUTINE X 2)  CULTURE, BLOOD (ROUTINE X 2)  URINE CULTURE  TYPE AND SCREEN  ABO/RH    Imaging Review Ct Renal Stone Study  09/04/2015  CLINICAL DATA:  Abdominal pain, rectal bleeding, renal insufficiency. Known skeletal  metastases. EXAM: CT ABDOMEN AND PELVIS WITHOUT CONTRAST  TECHNIQUE: Multidetector CT imaging of the abdomen and pelvis was performed following the standard protocol without IV contrast. COMPARISON:  MRI 07/24/2015 FINDINGS: Mild inflammatory stranding is present around the first and second portions of the duodenum. No extraluminal air. No bowel obstruction. There is moderate colonic diverticulosis. Bowel is otherwise remarkable only for extension of sigmoid colon into a large left inguinal hernia, continuing be on the lowest image of this study. There does not appear to be any obstruction or inflammation within the hernia, although it is not completely imaged. There are unremarkable unenhanced appearances of the liver, spleen, pancreas and adrenals. The left kidney is markedly atrophic. The right kidney is unremarkable. Ureters and urinary bladder are remarkable only for presence of a suprapubic urinary bladder catheter. The abdominal aorta is normal in caliber. There is mild atherosclerotic calcification. There is no adenopathy in the abdomen or pelvis. Multiple skeletal metastases are again evident, seen to better advantage on the recent MR examinations. This includes an expansile lesion of the left eighth rib which is not significantly changed. No acute findings are evident in the lower chest. Lung bases are clear except for minimal linear scarring or atelectasis. IMPRESSION: 1. Mild inflammatory stranding of the duodenal first and second portions. This could represent duodenitis or peptic ulcer disease. There is no extraluminal air. There is no bowel obstruction. 2. Marked left renal atrophy. 3. Large left inguinal hernia containing a significant length of sigmoid colon, nonobstructed. The entire hernia is not included within the field of view of this study. 4. Uncomplicated colonic diverticulosis. 5. Multiple skeletal metastases. Electronically Signed   By: Andreas Newport M.D.   On: 08/19/2015 03:57   I  personally reviewed and evaluated these images and lab results as a part of my medical decision-making.   EKG Interpretation None      MDM   Final diagnoses:  Lower GI bleed   Patient with elevation of liver enzymes. No comparison studies. Bili normal at 0.7. Ammonia of 60. INR 1.3. CBC elevated 29 hemoglobin 10.8. Down from 10 from his most recent admission. Lactate 2.3. His blood pressure improved to 105 with 500 of fluid.   Since pressures improved to 100. Continues to [facility] and be conversant. Discussed with the medicine resident. Patient will be admitted to stepdown bed.   Tanna Furry, MD 08/26/2015 [phone]

## 2015-08-13 NOTE — ED Notes (Addendum)
Pt arrives via EMS from [facility] for "dark brown blood in stool" x1 per EMS noted tonight. Per report, pt often complains of abdominal pain, reports abdominal pain tonight. Pt denies pain at this time, not answering many questions. Hx lumbar tumor, indwelling foley.

## 2015-08-14 ENCOUNTER — Inpatient Hospital Stay (HOSPITAL_COMMUNITY): Payer: Medicare Other

## 2015-08-14 DIAGNOSIS — G9340 Encephalopathy, unspecified: Secondary | ICD-10-CM

## 2015-08-14 DIAGNOSIS — Z515 Encounter for palliative care: Secondary | ICD-10-CM

## 2015-08-14 DIAGNOSIS — R410 Disorientation, unspecified: Secondary | ICD-10-CM

## 2015-08-14 DIAGNOSIS — Z789 Other specified health status: Secondary | ICD-10-CM

## 2015-08-14 DIAGNOSIS — N184 Chronic kidney disease, stage 4 (severe): Secondary | ICD-10-CM

## 2015-08-14 DIAGNOSIS — Z7189 Other specified counseling: Secondary | ICD-10-CM

## 2015-08-14 LAB — CBC
HEMATOCRIT: 29.9 % — AB (ref 39.0–52.0)
Hemoglobin: 9.9 g/dL — ABNORMAL LOW (ref 13.0–17.0)
MCH: 30.4 pg (ref 26.0–34.0)
MCHC: 33.1 g/dL (ref 30.0–36.0)
MCV: 91.7 fL (ref 78.0–100.0)
Platelets: 150 10*3/uL (ref 150–400)
RBC: 3.26 MIL/uL — ABNORMAL LOW (ref 4.22–5.81)
RDW: 15.2 % (ref 11.5–15.5)
WBC: 23.6 10*3/uL — AB (ref 4.0–10.5)

## 2015-08-14 LAB — COMPREHENSIVE METABOLIC PANEL
ALBUMIN: 1.8 g/dL — AB (ref 3.5–5.0)
ALT: 282 U/L — ABNORMAL HIGH (ref 17–63)
AST: 76 U/L — AB (ref 15–41)
Alkaline Phosphatase: 92 U/L (ref 38–126)
Anion gap: 20 — ABNORMAL HIGH (ref 5–15)
BILIRUBIN TOTAL: 1 mg/dL (ref 0.3–1.2)
BUN: 68 mg/dL — AB (ref 6–20)
CHLORIDE: 112 mmol/L — AB (ref 101–111)
CO2: 18 mmol/L — AB (ref 22–32)
Calcium: 7.7 mg/dL — ABNORMAL LOW (ref 8.9–10.3)
Creatinine, Ser: 2.38 mg/dL — ABNORMAL HIGH (ref 0.61–1.24)
GFR calc Af Amer: 26 mL/min — ABNORMAL LOW (ref >60)
GFR calc non Af Amer: 22 mL/min — ABNORMAL LOW (ref >60)
GLUCOSE: 133 mg/dL — AB (ref 65–99)
POTASSIUM: 3.9 mmol/L (ref 3.5–5.1)
SODIUM: 150 mmol/L — AB (ref 135–145)
Total Protein: 4.1 g/dL — ABNORMAL LOW (ref 6.5–8.1)

## 2015-08-14 LAB — LACTIC ACID, PLASMA: Lactic Acid, Venous: 1.6 mmol/L (ref 0.5–1.9)

## 2015-08-14 LAB — URINE CULTURE

## 2015-08-14 LAB — TYPE AND SCREEN
ABO/RH(D): A POS
ANTIBODY SCREEN: NEGATIVE
UNIT DIVISION: 0
UNIT DIVISION: 0

## 2015-08-14 LAB — HEPATITIS PANEL, ACUTE
HCV Ab: <0.1 s/co ratio (ref 0.0–0.9)
HEP A IGM: NEGATIVE
Hep B C IgM: NEGATIVE
Hepatitis B Surface Ag: NEGATIVE

## 2015-08-14 LAB — PROTIME-INR
INR: 1.61 — ABNORMAL HIGH (ref 0.00–1.49)
Prothrombin Time: 19.1 seconds — ABNORMAL HIGH (ref 11.6–15.2)

## 2015-08-14 LAB — HEMOGLOBIN
HEMOGLOBIN: 9.5 g/dL — AB (ref 13.0–17.0)
HEMOGLOBIN: 9.6 g/dL — AB (ref 13.0–17.0)
Hemoglobin: 9.3 g/dL — ABNORMAL LOW (ref 13.0–17.0)

## 2015-08-14 MED ORDER — HALOPERIDOL LACTATE 5 MG/ML IJ SOLN: 0.5000 mg | Freq: Four times a day (QID) | INTRAMUSCULAR | Status: DC | PRN

## 2015-08-14 MED ORDER — SODIUM CHLORIDE 0.9 % IV BOLUS (SEPSIS)
500.0000 mL | Freq: Once | INTRAVENOUS | Status: AC
Start: 2015-08-14 — End: 2015-08-14
  Administered 2015-08-14: 500 mL via INTRAVENOUS

## 2015-08-14 MED ORDER — VITAMIN K1 10 MG/ML IJ SOLN
5.0000 mg | Freq: Once | INTRAVENOUS | Status: AC
  Administered 2015-08-14: 5 mg via INTRAVENOUS
  Filled 2015-08-14 (×2): qty 0.5

## 2015-08-14 MED ORDER — ASPIRIN EC 81 MG PO TBEC
81.0000 mg | DELAYED_RELEASE_TABLET | Freq: Two times a day (BID) | ORAL | Status: DC
  Filled 2015-08-14: qty 1

## 2015-08-14 MED ORDER — FENTANYL CITRATE (PF) 100 MCG/2ML IJ SOLN
12.5000 ug | INTRAMUSCULAR | Status: DC | PRN
  Administered 2015-08-15: 12.5 ug via INTRAVENOUS
  Filled 2015-08-14 (×2): qty 2

## 2015-08-14 MED ORDER — SODIUM CHLORIDE 0.45 % IV SOLN
INTRAVENOUS | Status: DC
  Administered 2015-08-14: 11:00:00 via INTRAVENOUS

## 2015-08-14 MED ORDER — PREGABALIN 25 MG PO CAPS
25.0000 mg | ORAL_CAPSULE | Freq: Every day | ORAL | Status: DC
  Filled 2015-08-14: qty 1

## 2015-08-14 MED ORDER — BIOTENE DRY MOUTH MT LIQD
15.0000 mL | Freq: Four times a day (QID) | OROMUCOSAL | Status: DC
  Administered 2015-08-14 (×2): 15 mL via OROMUCOSAL

## 2015-08-14 MED ORDER — HYDROCERIN EX CREA
TOPICAL_CREAM | Freq: Two times a day (BID) | CUTANEOUS | Status: DC
  Administered 2015-08-14 (×2): via TOPICAL
  Filled 2015-08-14: qty 113

## 2015-08-14 MED ORDER — ATORVASTATIN CALCIUM 10 MG PO TABS: 10.0000 mg | ORAL_TABLET | Freq: Every day | ORAL | Status: DC

## 2015-08-14 MED ORDER — OXYCODONE HCL 5 MG PO TABS: 2.5000 mg | ORAL_TABLET | ORAL | Status: DC | PRN

## 2015-08-14 MED ORDER — METHYLPREDNISOLONE SODIUM SUCC 40 MG IJ SOLR
40.0000 mg | Freq: Four times a day (QID) | INTRAMUSCULAR | Status: DC
  Administered 2015-08-14 (×3): 40 mg via INTRAVENOUS
  Filled 2015-08-14 (×3): qty 1

## 2015-08-14 NOTE — Progress Notes (Signed)
Family Medicine Teaching Service Daily Progress Note Intern Pager: [phone]  Patient name: James Small Medical record number: [id number] Date of birth: 18-Apr-1922 Age: 82 y.o. Gender: male  Primary Care Provider: Limmie Patricia, MD Consultants: GI, palliative  Code Status: FULL  Pt Overview and Major Events to Date:  7/2 - admitted for GI bleed; CCM consulted for unstable vitals, recommended continued IVF rehydration; received 2U PRBC  Assessment and Plan: James Small is a 82 y.o. male presenting with Rectal Bleeding . PMH is significant for skeletal metastasis, CAD s/p CABG, HFrEF, BPH and neurogenic bladder with indwelling suprapubic catheter, hypothyroidism, chronic back pain.  GI Bleed. Hgb 10.8 on admission (baseline 12-14), though patient appears significant dehydrated so this may be falsely elevated. Concern for upper GI source given history of prednisone and NSAID use and duodenitis noted on abdominal CT. Acute lower GI bleed also possible (diverticulosis noted on abdominal CT). Rectal exam in ED without signs of hemorrhoids or fissures. Hgb decreased to 8.6 yesterday, so patient received 2U PRBC. Post-transfusion H&H improved to 10.5, but now decreased to 9.5 this AM. Still with dark stools overnight, though not as grossly bloody.  - Advance to clear liquid diet per GI - Trend H/H q4hrs. Low threshold for transfusion.  - Protonix infusion - GI consulted - appreciate recs - Hold home NSAIDs - Vit K x1 given elevated INR  SIRS / Lactic Acidosis. Meets 4/4 SIRS criteria on admission (though patient has history of chronic leukocytosis). Lactic acid 2.34 on admission. qSOFA 2/3 (SBP and RR). CXR with no consolidations or effusions. No clear source of infection, though patient appears significantly dehydrated on exam - which could be contributing. Lactic acid increased to 5.1 yesterday, but improved to 1.6 this AM. Per CCM yesterday, unstable vitals most likely 2/2  hypovolemic shock from blood loss and dehydration. Patient now s/p 6 boluses with persistent hypotension and tachypnea. - Blood and Urine cultures pending - Cont vanc/zosyn until cultures return - Trend lactic acid - NS 100cc/hr   - Seen by CCM yesterday - consult again if vitals become more unstable despite additional boluses  - Palliative care to see today to discuss goals of care with family  Chronic Indwelling Suprapubic Catheter. Secondary to BPH and neurogenic bladder. Significant amount of urine soaking through surrounding dressing on admission. Seen by [place] yesterday, who noted no signs of infection. Catheter is draining well and does not appear to be clogged, so per WOC, no need to exchange at this time.  - WOC continuing to follow - Will exchange catheter when due  Elevated Transaminases. AST 196, ALT 557 on admission. INR 1.34. No prior values available. No history of liver disease. On tylenol as needed at SNF. Unclear etiology. Differential includes medication induced toxicity (also on low dose atorvastatin as outpatient), infectious etiology, or metastatic disease. No abnormalities noted on RUQ [place]. Acetaminophen level WNL. INR increased from 1.3 on admission to 1.6 this AM. Transaminases improved this AM, though still elevated.  - Check hepatitis panel - pending - Trend CMP - Trend INR  CKD 4. Cr 2.2 on admission (baseline 1.8-2). Small increase likely due to dehydration. Patient also on ibuprofen at facility for chronic back pain. Cr 2.38 this AM.  - IVF as above - Trend BMP - Avoid NSAIDs  CAD / HTN. S/p CABG in 1996. Myoview in 2015 with no high risk symptoms. Remains hypotensive despite repeated fluid boluses.  - Resume home ASA and atorvastatin - Hold home atenolol given  soft BPs  HFrEF. Myoview in 2015 with EF of 31%. Currently with no signs of overload. - IVF as above  Skeletal Metastasis. Likely 2/2 prostate cancer given elevated BPH. Family and patient have declined  further work up, but would still like for patient to be full code. Family's stated goals of care are inconsistent with code status, and they are amenable to meeting with palliative care.  - Palliative care consulted - will see patient today  Hypothyroidism. On synthroid 19mcg at home - IV synthroid 22mcg daily while NPO, resume oral formulation once taking PO  Chronic Back Pain. On oxycodone, prednisone 25mg  bid, ibuprofen, tylenol, Lyrica as outpatient. - Avoid NSAIDs given CKD and concern for upper GI bleed - Will avoid narcotics for now given soft BPs - Begin stress dose steroid at solumedrol 40mg  q6 IV (equivalent to PO prednisone 50mg ) per pharm recommendations  - Lidocaine patch - Resume home Lyrica and oxy 2.5mg  PRN  FEN/GI: NS @100cc /hr, clears, IV Protonix Prophylaxis: SCDs given GI bleed  Disposition: SDU, but low threshold to transfer to ICU  Subjective:  Patient remained hypotensive overnight despite multiple boluses and 2U PRBC during the day yesterday. Lowest BP in 70s (not recorded in Vitals flowsheet). Nursing contacting [place] [facility] rather than primary team, who recommended an additional fluid bolus. Patient's vitals improved somewhat after additional bolus.  Per nursing, patient still with dark stools overnight, but less frank blood than on admission.  Patient reports back pain this AM but has no complaints otherwise.   Objective: Temp:  [97.3 F (36.3 C)-98.7 F (37.1 C)] 98.4 F (36.9 C) (07/03 0700) Pulse Rate:  [84-117] 88 (07/03 0800) Resp:  [19-43] 29 (07/03 0800) BP: (74-158)/(39-138) 84/51 mmHg (07/03 0800) SpO2:  [94 %-100 %] 94 % (07/03 0800) Physical Exam: General: chronically ill-appearing male lying in bed Cardiovascular: RRR, no murmur appreciated though distant heart sounds difficult to auscultate Respiratory: CTAB, normal WOB on RA Abdomen: soft, non-tender, non-distended, suprapubic catheter in place with clean dressing and no visible  erythema Neuro: alert, oriented to person and season (unsure of month but knew it was summer)  Laboratory:  Recent Labs Lab 09/06/2015 0216  09/08/2015 2319 08/14/15 0424 08/14/15 0809  WBC 29.0*  --   --  23.6*  --   HGB 10.8*  < > 10.5* 9.9* 9.5*  HCT 33.3*  --   --  29.9*  --   PLT 152  --   --  150  --   < > = values in this interval not displayed.  Recent Labs Lab 08/28/2015 0216 08/14/15 0424  NA 140 150*  K 4.7 3.9  CL 110 112*  CO2 20* 18*  BUN 69* 68*  CREATININE 2.22* 2.38*  CALCIUM 8.0* 7.7*  PROT 5.5* 4.1*  BILITOT 0.7 1.0  ALKPHOS 131* 92  ALT 557* 282*  AST 196* 76*  GLUCOSE 146* 133*    Hgb - 7.8 > 10.5 (s/p 2U PRBC) > 9.9 Lactic acid - 2.4 > 5.1 > 1.6  Imaging/Diagnostic Tests: Dg Chest 1 View  09/01/2015  CLINICAL DATA:  Tachypnea. EXAM: CHEST 1 VIEW COMPARISON:  10/29/2011 FINDINGS: Shallow inspiration. Minor atelectatic appearing linear basilar opacities, accentuated due to the shallow degree of inspiration. No airspace consolidation. No large effusion. Normal pulmonary vasculature. IMPRESSION: Shallow inspiration.  No consolidation or large effusion. Electronically Signed   By: Andreas Newport M.D.   On: 09/10/2015 06:57   Dg Chest Port 1 View  08/14/2015  CLINICAL DATA:  Dyspnea, history of previous CABG, gastrointestinal hemorrhage malignancy metastatic to bone EXAM: PORTABLE CHEST 1 VIEW COMPARISON:  Chest x-ray of August 13, 2015 FINDINGS: The lungs remain hypo inflated. There is persistent subsegmental atelectasis at the lung bases. The heart is top-normal in size. The pulmonary vascularity is normal. There are post CABG changes. There are degenerative changes of the right shoulder. IMPRESSION: Persistent bilateral hypo inflation with mild subsegmental atelectasis. There is no CHF nor significant pleural effusion. Electronically Signed   By: David  Martinique M.D.   On: 08/14/2015 07:34   Ct Renal Stone Study  08/19/2015  CLINICAL DATA:  Abdominal pain,  rectal bleeding, renal insufficiency. Known skeletal metastases. EXAM: CT ABDOMEN AND PELVIS WITHOUT CONTRAST TECHNIQUE: Multidetector CT imaging of the abdomen and pelvis was performed following the standard protocol without IV contrast. COMPARISON:  MRI 07/24/2015 FINDINGS: Mild inflammatory stranding is present around the first and second portions of the duodenum. No extraluminal air. No bowel obstruction. There is moderate colonic diverticulosis. Bowel is otherwise remarkable only for extension of sigmoid colon into a large left inguinal hernia, continuing be on the lowest image of this study. There does not appear to be any obstruction or inflammation within the hernia, although it is not completely imaged. There are unremarkable unenhanced appearances of the liver, spleen, pancreas and adrenals. The left kidney is markedly atrophic. The right kidney is unremarkable. Ureters and urinary bladder are remarkable only for presence of a suprapubic urinary bladder catheter. The abdominal aorta is normal in caliber. There is mild atherosclerotic calcification. There is no adenopathy in the abdomen or pelvis. Multiple skeletal metastases are again evident, seen to better advantage on the recent MR examinations. This includes an expansile lesion of the left eighth rib which is not significantly changed. No acute findings are evident in the lower chest. Lung bases are clear except for minimal linear scarring or atelectasis. IMPRESSION: 1. Mild inflammatory stranding of the duodenal first and second portions. This could represent duodenitis or peptic ulcer disease. There is no extraluminal air. There is no bowel obstruction. 2. Marked left renal atrophy. 3. Large left inguinal hernia containing a significant length of sigmoid colon, nonobstructed. The entire hernia is not included within the field of view of this study. 4. Uncomplicated colonic diverticulosis. 5. Multiple skeletal metastases. Electronically Signed   By:  Andreas Newport M.D.   On: 08/12/2015 03:57   US Abdomen Limited Ruq  08/12/2015  CLINICAL DATA:  Rectal bleeding. Abnormal liver function tests. Abdominal pain for 1 day. EXAM: US ABDOMEN LIMITED - RIGHT UPPER QUADRANT COMPARISON:  None. FINDINGS: Gallbladder: No gallstones or wall thickening visualized. No sonographic Murphy sign noted by sonographer. Common bile duct: Diameter: 3.6 mm Liver: No focal lesion identified. Within normal limits in parenchymal echogenicity. IMPRESSION: Normal liver, gallbladder and bile ducts. Electronically Signed   By: Andreas Newport M.D.   On: 08/18/2015 06:55     [place], MD 08/14/2015, 10:47 AM PGY-2, [place] Intern pager: [phone], text pages welcome

## 2015-08-14 NOTE — Consult Note (Signed)
Consultation Note Date: 08/14/2015   Patient Name: James Small  DOB: Oct 28, 1922  MRN: [id number]  Age / Sex: 82 y.o., male  PCP: James Skeens, MD Referring Physician: Alveda Reasons, MD  Reason for Consultation: Establishing goals of care  HPI/Patient Profile: 82 y.o. male  with past medical history of spinal mass (possibly metastatic prostate cancer), indwelling foley, CKD, CAD s/p CABG in '96 who was admitted on 09/02/2015 with rectal bleeding.  He developed hypovolemic shock and received 2 units of blood and 6 boluses overnight.  He continues to have melena.  The patient was living independently at home prior to his 6/12 hospital admission for fall.  He was discharged to SNF.  He had a Palliative Medicine consultation during that admission and opted for no artificial feeding, no work up for the mass in his spine, no radiation or surgical procedures, but he did want to remain a full code.  Clinical Assessment and Goals of Care: After examining the patient, I spoke on the phone with his daughter James Small.  Ms. James Small expressed that she was exhausted but coming to the hospital later today.  She had a difficult time grasping how tenuous her father's current condition is.  After our conversation regarding potential EGD, Intubation, ICU - James Small told me her father has a documented desire for a natural death.  He would not want to be intubated if it was very likely that he would die on the vent.  However, she said he would want CPR, shock, etc...  She does not want him to have invasive procedures.  At this point James Small was crying on the phone.  We decided to talk more in person later today.   NEXT OF KIN:  Primary decision makers - adult children James Small and James Small    SUMMARY OF RECOMMENDATIONS    DNI.  But per family at this point he would want CPR/Code Blue  Family does want boluses, blood transfusion  No EGD  or invasive work up.  Patient is experiencing some confusion / delirium - would like to recommend something to calm him, but will refrain for now due to hypotension.  Will plan to meet with family later today to further define goals of care.  Code Status/Advance Care Planning:  Limited code.   DNI    Symptom Management:   Per primary team.  Palliative Prophylaxis:   Aspiration, Delirium Protocol and Frequent Pain Assessment  Additional Recommendations (Limitations, Scope, Preferences):  No Artificial Feeding, No Chemotherapy, No Diagnostics, No Radiation and No Tracheostomy   Prognosis:   Unable to determine.  Unfortunately I feed James Small prognosis is very poor.  I believe he may not survive this hospitalization, and if he does, Hospice services are certainly appropriate.  Discharge Planning: To Be Determined      Primary Diagnoses: Present on Admission:  . GI bleed  I have reviewed the medical record, interviewed the patient and family, and examined the patient. The following aspects are pertinent.  Past Medical History  Diagnosis  Date  . Inguinal hernia     left  . Thyroid disease   . Dyslipidemia   . BPH (benign prostatic hyperplasia)   . Coronary artery disease     status post cabg in 1996 by James Small with James Small to the James Small, SVG to James Small and James Small, SVG to circumflex, SVG to right coronary artery  . Hypertension   . Hypothyroidism   . Chronic kidney disease   . Presence of indwelling urinary catheter   . Weight loss     25 lbs over last few months per pt   . Arthritis     lower back - pt has epidural injections every few months   . Cancer [facility])     metasatic / spine    Social History   Social History  . Marital Status: Married    Spouse Name: N/A  . Number of Children: N/A  . Years of Education: N/A   Social History Main Topics  . Smoking status: Never Smoker   . Smokeless tobacco: Never Used  . Alcohol Use: No  . Drug Use: No  . Sexual  Activity: Not Asked   Other Topics Concern  . None   Social History Narrative   Family History  Problem Relation Age of Onset  . Family history unknown: Yes   Scheduled Meds: . Chlorhexidine Gluconate Cloth  6 each Topical Q0600  . levothyroxine  75 mcg Intravenous Daily  . lidocaine  1 patch Transdermal Q24H  . methylPREDNISolone (SOLU-MEDROL) injection  40 mg Intravenous Q6H  . mupirocin ointment  1 application Nasal BID  . [START ON 08/16/2015] pantoprazole  40 mg Intravenous Q12H  . piperacillin-tazobactam (ZOSYN)  IV  3.375 g Intravenous Q8H  . sodium chloride flush  3 mL Intravenous Q12H  . vancomycin  1,000 mg Intravenous Q24H   Continuous Infusions: . sodium chloride    . pantoprozole (PROTONIX) infusion 8 mg/hr (08/23/2015 2159)   PRN Meds:.  No Known Allergies Review of Systems:  Patient is unable to provide.  Physical Exam  Well developed, very elderly male, yelling help in a weak voice Pale complexion CV rrr Resp:  NAD Abdomen soft, nT, smell of melena in the room Extremities:  No edema, able to move all 4.  Vital Signs: BP 84/51 mmHg  Pulse 88  Temp(Src) 98.4 F (36.9 C) (Axillary)  Resp 29  Ht 5\' 8"  (1.727 m)  Wt 82.4 kg (181 lb 10.5 oz)  BMI 27.63 kg/m2  SpO2 94% Pain Assessment: PAINAD   Pain Score: 0-No pain   SpO2: SpO2: 94 % O2 Device:SpO2: 94 % O2 Flow Rate: .   IO: Intake/output summary:  Intake/Output Summary (Last 24 hours) at 08/14/15 1029 Last data filed at 08/14/15 [id number]  Gross per 24 hour  Intake 5718.33 ml  Output   1700 ml  Net 4018.33 ml    LBM: Last BM Date: 08/14/15 Baseline Weight: Weight: 82.4 kg (181 lb 10.5 oz) Most recent weight: Weight: 82.4 kg (181 lb 10.5 oz)     Palliative Assessment/Data:     Time In: 9:30 Time Out: 10:50 Time Total: 80 min Greater than 50%  of this time was spent counseling and coordinating care related to the above assessment and plan.  Signed by: Imogene Burn, PA-C Palliative  Medicine Pager: [phone]   Please contact Palliative Medicine Team phone at [phone] for questions and concerns.  For individual provider: See Shea Evans

## 2015-08-14 NOTE — Progress Notes (Signed)
FMTS Interim Progress Note  Called to room by patient's nurse for low BPs and worsening confusion. Says that he has more difficulty remember how to eat since yesterday, and has had difficulty follow commands. Still responding to questions appropriately.  Subjective: Blood pressure 81/41, pulse 86, temperature 97.9 F (36.6 C), temperature source Axillary, resp. rate 19, height 5\' 8"  (1.727 m), weight 181 lb 10.5 oz (82.4 kg), SpO2 94 %. Neuro: Alert and oriented x3 CV: RRR PULM: No crackles Ext: No edema  Plan: Currently appears to be mentating normally - responding to questions appropriately. Still appears dehydrated. No need for pressors or CCM at this point. Palliative care consult pending. Will give 500cc bolus and reassess.

## 2015-08-14 NOTE — Progress Notes (Signed)
[facility] PCCM  S: Received fluid boluses yesterday, lactic acid improved, HR improved but still somewhat hypotensive O:  Filed Vitals:   08/14/15 0425 08/14/15 0600 08/14/15 0700 08/14/15 0800  BP: 88/39 104/62 92/62 84/51   Pulse: 84 92 92 88  Temp:   98.4 F (36.9 C)   TempSrc:   Axillary   Resp: 19 23 23 29   Height:      Weight:      SpO2: 97% 97% 98% 94%   RA  Gen: pale, chronically ill appearing male HENT: OP clear, MM dry PULM: CTA B, normal percussion CV: RRR, no clear m/g/r on my exam GI: BS+, soft, nontender Derm: no cyanosis or rash Neuro: arouses to voice, oriented to [facility] for me, but then quickly inattentive, slurred speech, incoherent  CBC    Component Value Date/Time   WBC 23.6* 08/14/2015 0424   RBC 3.26* 08/14/2015 0424   HGB 9.5* 08/14/2015 0809   HCT 29.9* 08/14/2015 0424   PLT 150 08/14/2015 0424   MCV 91.7 08/14/2015 0424   MCH 30.4 08/14/2015 0424   MCHC 33.1 08/14/2015 0424   RDW 15.2 08/14/2015 0424   LYMPHSABS 3.3 07/24/2015 0842   MONOABS 1.4* 07/24/2015 0842   EOSABS 0.2 07/24/2015 0842   BASOSABS 0.0 07/24/2015 0842    Impression/plan: Acute GI bleed Anemia Lactic acidosis Chronic suprapubic catheter Metastatic prostate cancer Hypotension Systolic heart failure Chronic kidney disease Acute encephalopathy  Discussion: 82 y/o male with multiple medical problems here with an acute GI bleed leading to hypotension, lactic acidosis.  It is not clear at all that he is infected, though he is at risk for a urinary tract infection given his chronic indwelling suprapubic catheter.  His lactic acid has cleared with IVF resuscitation, but his overall prognosis is poor given his advanced age, multiple comorbid illnesses, and acute illness.  If his conditioned worsened to the point that he required ventilatory support or vasopressor then the likelihood of survival to a normal functional status or meaningful quality of life is essentially zero. Based on  this I completely agree with having a goals of care conversation, I do not at all support ICU transfer as that level of care would not be medically beneficial and would cause more harm than good.  PCCM to sign off  Roselie Awkward, MD [place] PCCM Pager: [phone] Cell: [phone] After 3pm or if no response, call [phone]

## 2015-08-14 NOTE — Progress Notes (Signed)
[facility] Progress Note  Subjective: Patient still relatively unresponsive, apparently hemodynamically stable overnight area nursing A just changed depends and dark tarry stool seen.  Objective: Vital signs in last 24 hours: Temp:  [97.3 F (36.3 C)-98.7 F (37.1 C)] 98.4 F (36.9 C) (07/03 0700) Pulse Rate:  [39-117] 92 (07/03 0700) Resp:  [19-43] 23 (07/03 0700) BP: (74-158)/(39-138) 92/62 mmHg (07/03 0700) SpO2:  [95 %-100 %] 98 % (07/03 0700) Weight change:    PE: Unchanged  Lab Results: Results for orders placed or performed during the hospital encounter of 08/17/2015 (from the past 24 hour(s))  Acetaminophen level     Status: Abnormal   Collection Time: 08/27/2015 11:47 AM  Result Value Ref Range   Acetaminophen (Tylenol), Serum <10 (L) 10 - 30 ug/mL  Lactic acid, plasma     Status: Abnormal   Collection Time: 08/12/2015 11:47 AM  Result Value Ref Range   Lactic Acid, Venous 5.1 (HH) 0.5 - 1.9 mmol/L  Hemoglobin     Status: Abnormal   Collection Time: 09/04/2015 11:47 AM  Result Value Ref Range   Hemoglobin 8.6 (L) 13.0 - 17.0 g/dL  MRSA PCR Screening     Status: Abnormal   Collection Time: 09/09/2015  1:26 PM  Result Value Ref Range   MRSA by PCR POSITIVE (A) NEGATIVE  Prepare RBC     Status: None   Collection Time: 09/11/2015  2:40 PM  Result Value Ref Range   Order Confirmation ORDER PROCESSED BY BLOOD BANK   Hemoglobin     Status: Abnormal   Collection Time: 08/14/2015  3:28 PM  Result Value Ref Range   Hemoglobin 7.8 (L) 13.0 - 17.0 g/dL  Hemoglobin     Status: Abnormal   Collection Time: 08/22/2015 11:19 PM  Result Value Ref Range   Hemoglobin 10.5 (L) 13.0 - 17.0 g/dL  CBC     Status: Abnormal   Collection Time: 08/14/15  4:24 AM  Result Value Ref Range   WBC 23.6 (H) 4.0 - 10.5 K/uL   RBC 3.26 (L) 4.22 - 5.81 MIL/uL   Hemoglobin 9.9 (L) 13.0 - 17.0 g/dL   HCT 29.9 (L) 39.0 - 52.0 %   MCV 91.7 78.0 - 100.0 fL   MCH 30.4 26.0 - 34.0 pg   MCHC 33.1  30.0 - 36.0 g/dL   RDW 15.2 11.5 - 15.5 %   Platelets 150 150 - 400 K/uL  Comprehensive metabolic panel     Status: Abnormal   Collection Time: 08/14/15  4:24 AM  Result Value Ref Range   Sodium 150 (H) 135 - 145 mmol/L   Potassium 3.9 3.5 - 5.1 mmol/L   Chloride 112 (H) 101 - 111 mmol/L   CO2 18 (L) 22 - 32 mmol/L   Glucose, Bld 133 (H) 65 - 99 mg/dL   BUN 68 (H) 6 - 20 mg/dL   Creatinine, Ser 2.38 (H) 0.61 - 1.24 mg/dL   Calcium 7.7 (L) 8.9 - 10.3 mg/dL   Total Protein 4.1 (L) 6.5 - 8.1 g/dL   Albumin 1.8 (L) 3.5 - 5.0 g/dL   AST 76 (H) 15 - 41 U/L   ALT 282 (H) 17 - 63 U/L   Alkaline Phosphatase 92 38 - 126 U/L   Total Bilirubin 1.0 0.3 - 1.2 mg/dL   GFR calc non Af Amer 22 (L) >60 mL/min   GFR calc Af Amer 26 (L) >60 mL/min   Anion gap 20 (H) 5 - 15  Protime-INR  Status: Abnormal   Collection Time: 08/14/15  4:24 AM  Result Value Ref Range   Prothrombin Time 19.1 (H) 11.6 - 15.2 seconds   INR 1.61 (H) 0.00 - 1.49  Lactic acid, plasma     Status: None   Collection Time: 08/14/15  4:24 AM  Result Value Ref Range   Lactic Acid, Venous 1.6 0.5 - 1.9 mmol/L  Hemoglobin     Status: Abnormal   Collection Time: 08/14/15  8:09 AM  Result Value Ref Range   Hemoglobin 9.5 (L) 13.0 - 17.0 g/dL    Studies/Results: Dg Chest 1 View  08/20/2015  CLINICAL DATA:  Tachypnea. EXAM: CHEST 1 VIEW COMPARISON:  10/29/2011 FINDINGS: Shallow inspiration. Minor atelectatic appearing linear basilar opacities, accentuated due to the shallow degree of inspiration. No airspace consolidation. No large effusion. Normal pulmonary vasculature. IMPRESSION: Shallow inspiration.  No consolidation or large effusion. Electronically Signed   By: Andreas Newport M.D.   On: 08/12/2015 06:57   Dg Chest Port 1 View  08/14/2015  CLINICAL DATA:  Dyspnea, history of previous CABG, gastrointestinal hemorrhage malignancy metastatic to bone EXAM: PORTABLE CHEST 1 VIEW COMPARISON:  Chest x-ray of August 13, 2015  FINDINGS: The lungs remain hypo inflated. There is persistent subsegmental atelectasis at the lung bases. The heart is top-normal in size. The pulmonary vascularity is normal. There are post CABG changes. There are degenerative changes of the right shoulder. IMPRESSION: Persistent bilateral hypo inflation with mild subsegmental atelectasis. There is no CHF nor significant pleural effusion. Electronically Signed   By: David  Martinique M.D.   On: 08/14/2015 07:34   Ct Renal Stone Study  08/25/2015  CLINICAL DATA:  Abdominal pain, rectal bleeding, renal insufficiency. Known skeletal metastases. EXAM: CT ABDOMEN AND PELVIS WITHOUT CONTRAST TECHNIQUE: Multidetector CT imaging of the abdomen and pelvis was performed following the standard protocol without IV contrast. COMPARISON:  MRI 07/24/2015 FINDINGS: Mild inflammatory stranding is present around the first and second portions of the duodenum. No extraluminal air. No bowel obstruction. There is moderate colonic diverticulosis. Bowel is otherwise remarkable only for extension of sigmoid colon into a large left inguinal hernia, continuing be on the lowest image of this study. There does not appear to be any obstruction or inflammation within the hernia, although it is not completely imaged. There are unremarkable unenhanced appearances of the liver, spleen, pancreas and adrenals. The left kidney is markedly atrophic. The right kidney is unremarkable. Ureters and urinary bladder are remarkable only for presence of a suprapubic urinary bladder catheter. The abdominal aorta is normal in caliber. There is mild atherosclerotic calcification. There is no adenopathy in the abdomen or pelvis. Multiple skeletal metastases are again evident, seen to better advantage on the recent MR examinations. This includes an expansile lesion of the left eighth rib which is not significantly changed. No acute findings are evident in the lower chest. Lung bases are clear except for minimal  linear scarring or atelectasis. IMPRESSION: 1. Mild inflammatory stranding of the duodenal first and second portions. This could represent duodenitis or peptic ulcer disease. There is no extraluminal air. There is no bowel obstruction. 2. Marked left renal atrophy. 3. Large left inguinal hernia containing a significant length of sigmoid colon, nonobstructed. The entire hernia is not included within the field of view of this study. 4. Uncomplicated colonic diverticulosis. 5. Multiple skeletal metastases. Electronically Signed   By: Andreas Newport M.D.   On: 08/29/2015 03:57   US Abdomen Limited Ruq  08/25/2015  CLINICAL DATA:  Rectal bleeding. Abnormal liver function tests. Abdominal pain for 1 day. EXAM: US ABDOMEN LIMITED - RIGHT UPPER QUADRANT COMPARISON:  None. FINDINGS: Gallbladder: No gallstones or wall thickening visualized. No sonographic Murphy sign noted by sonographer. Common bile duct: Diameter: 3.6 mm Liver: No focal lesion identified. Within normal limits in parenchymal echogenicity. IMPRESSION: Normal liver, gallbladder and bile ducts. Electronically Signed   By: Andreas Newport M.D.   On: 09/11/2015 06:55      Assessment: Non-destabilizing GI bleed in an elderly decrepit patient unclear whether upper or lower.  Plan: Continue supportive care and empiric PPI therapy. Will follow with you and if bleeding persistent, consider EGD. Doubt he would tolerate or be able to be prepped for colonoscopy  Okay for clear liquids from my standpoint.     Sharolyn Weber C 08/14/2015, 9:05 AM  Pager [phone] If no answer or after 5 PM call [phone]

## 2015-08-14 NOTE — Progress Notes (Signed)
Pt SBPs back in the 80s.  [place] and Primary MD Avon Gully on call notified.  575mL bolus ordered.  Will continue to monitor.

## 2015-08-14 NOTE — Progress Notes (Signed)
No charge note.  Met with daughter, Terri Skains, and her husband, Pilar Plate at bedside then in conference room.  Explained that Mr. Edelson condition is very tenuous.  Discussed elevation of lactic acid, continued bleeding, elevated INR.  I specifically stated that he is unlikely to survive a code Blue, and that my best medical recommendation would be that he be made a DNR.  Collie Siad took notes and seemed to understand my message.  She stated that she needed to discuss DNR with her brother Cheral Bay who will be here later today.  Collie Siad wanted to continue IV fluid boluses, blood transfusions etc... For now.  Pilar Plate (patient's son in law) asked about Mr. jaythan hinely color and work of breathing.  I told him that I believe Mr. Panuco is in the process of dying.  Assessment:  Patient is currently DNI.  Family still wants full scope treatment otherwise.  Will continue to work with family (Son) on DNR status today.  I told the family we would continue to follow him in the hospital.     I anticipate he will be comfort care in the next day or 2.  Imogene Burn, [facility] Pager: [phone]

## 2015-08-14 NOTE — Progress Notes (Signed)
No charge note.  Met with Son James Small) and Dtr James Small) at bedside.   They elect DNR.  Family will likely choose comfort care only 7/4.  Family requested blood draws be discontinued.  Fentanyl and low dose haldol added for pain and agitation.  Imogene Burn, [place] [facility] Pager: [phone]

## 2015-08-15 MED ORDER — SODIUM CHLORIDE 0.9 % IV BOLUS (SEPSIS)
500.0000 mL | Freq: Once | INTRAVENOUS | Status: AC
Start: 2015-08-15 — End: 2015-08-15
  Administered 2015-08-15: 500 mL via INTRAVENOUS

## 2015-08-19 LAB — CULTURE, BLOOD (ROUTINE X 2)
CULTURE: NO GROWTH
Culture: NO GROWTH

## 2015-09-12 NOTE — Progress Notes (Signed)
Pt had HR of 150 and began to brady down to the 40's and progressed to Asystole. Respirations agonal and progressed to apnea. Patient is DNR and family at bedside, family educated about patient transitioning and understands. At [place], RN and Ronald Lobo, RN verified Asystole. MD [place] notified. MD [place] at bedside.  [facility] [facility] called and not suitable for donation. Number given [phone]. Medical examiner notified and pt not candidate for ME case. Postmortem care complete and funeral home [employer]erry Bruin and Barbarann Ehlers have been notified. Death certificate signed.

## 2015-09-12 NOTE — Progress Notes (Signed)
Contacted family medicine Md on call about patients soft BP's. VSS. Pt continues to have bloody BM x2 Order given for 500cc bolus of 1/2 NS.

## 2015-09-12 NOTE — Progress Notes (Signed)
Pt HR sustaining in the 130-150's, HR is also elevated in the 30-40's, Contacted family Medicine On-call. No new orders given.

## 2015-09-12 NOTE — Progress Notes (Signed)
Contacted by Dr. Andy Gauss concerning patient death. Patient moved to [place] and made DNR on 08/14/15. Family in room and very appreciative of Palliative Care and Family Medicine for providing care for their father and making him comfortable; helped significantly with acceptance of their father's death who was independent only a few short weeks ago. Time of Death 2015/09/11 0123. Death Certificate filled out and returned to floor.  Dr. Gerlean Ren 2015-09-11, 2:20 AM

## 2015-09-12 NOTE — Discharge Summary (Signed)
[facility] Death Summary  Patient name: James Small Medical record number: [id number] Date of birth: [birth year] Age: 82 y.o. Gender: male Date of Admission: 08-27-2015  Date of Death: 08-29-15 Admitting Physician: No admitting provider for patient encounter.  Primary Care Provider: Limmie Patricia, MD Consultants: GI, CCM, palliative care  Indication for Hospitalization: GI bleed  Discharge Diagnoses/Problem List:  Patient Active Problem List   Diagnosis Date Noted  . DNI (do not intubate)   . GI bleed 08/27/2015  . Lactic acidosis 08-27-2015  . Gastrointestinal hemorrhage 27-Aug-2015  . Hypotension 08/27/2015  . Elevated transaminase level   . Lower GI bleed   . Arterial hypotension   . Tachycardia   . Osteopenia determined by x-ray 08/07/2015  . Lack of appetite 08/07/2015  . Indwelling catheter present on admission ([place]) 08/07/2015  . Sleepiness 08/07/2015  . Compression fracture of L1 lumbar vertebra ([place]) 08/07/2015  . CKD (chronic kidney disease), stage III   . Acute delirium   . Palliative care encounter   . Goals of care, counseling/discussion   . Bone metastases ([place])   . Intradural mass ([place])   . Back injury 07/24/2015  . Back pain 07/24/2015  . Back injuries   . Fall   . Renal insufficiency   . High risk social situation   . ESSENTIAL HYPERTENSION, BENIGN 12/06/2008  . Hypothyroidism 2008/12/15  . DYSLIPIDEMIA 2008-12-15  . Coronary atherosclerosis 12-15-08   Disposition: deceased  Brief Hospital Course:  James Small is 82 yo M admitted for GI bleed.   GI Bleed James Small was admitted after noting grossly bloody stools. Hgb was 10.8 on admission, but quickly dropped to 8.6 within 8 hours of admission. He was transfused 2U PRBC with improvement in Hgb. GI was consulted, who felt that patient was not a good candidate for EGD or other invasive interventions. He was started on Protonix drip. He was eventually  transitioned to comfort care due to persistently unstable vitals.   SIRS Patient met SIRS criteria upon admission, with hypothermia, tachycardia, tachypnea, hypotension, and lactic acidosis. Lactic acidosis improved with fluid administration, however patient's unstable vitals, particularly hypotension, persisted despite multiple fluid boluses. CCM was consulted when patient was full code, but felt that his symptoms were more likely due to hypovolemic shock than infection, and recommended continuing IVF rehydration. Palliative care was consulted on day prior to patient's expiration, and he was transitioned to comfort care given his lack of improvement.   Significant Procedures: None  Significant Labs and Imaging:   Recent Labs Lab 2015-08-27 0216  08/14/15 0424 08/14/15 0809 08/14/15 1119 08/14/15 1638  WBC 29.0*  --  23.6*  --   --   --   HGB 10.8*  < > 9.9* 9.5* 9.3* 9.6*  HCT 33.3*  --  29.9*  --   --   --   PLT 152  --  150  --   --   --   < > = values in this interval not displayed.  Recent Labs Lab 08/27/15 0216 08/14/15 0424  NA 140 150*  K 4.7 3.9  CL 110 112*  CO2 20* 18*  GLUCOSE 146* 133*  BUN 69* 68*  CREATININE 2.22* 2.38*  CALCIUM 8.0* 7.7*  ALKPHOS 131* 92  AST 196* 76*  ALT 557* 282*  ALBUMIN 2.5* 1.8*     James Swatzell Delman Cheadle, MD 29-Aug-2015, 8:59 AM PGY-2, [place]

## 2015-09-12 DEATH — deceased

## 2018-01-10 IMAGING — US US ABDOMEN LIMITED
1 series · 14 of 25 positions shown · non-contrast
Comparison: None.

CLINICAL DATA: Rectal bleeding. Abnormal liver function tests.
Abdominal pain for 1 day.

EXAM:
US ABDOMEN LIMITED - RIGHT UPPER QUADRANT

[Series 1: us abdomen limited · 0.20mm/px · 14 of 28 slices shown]
[im 1/28]
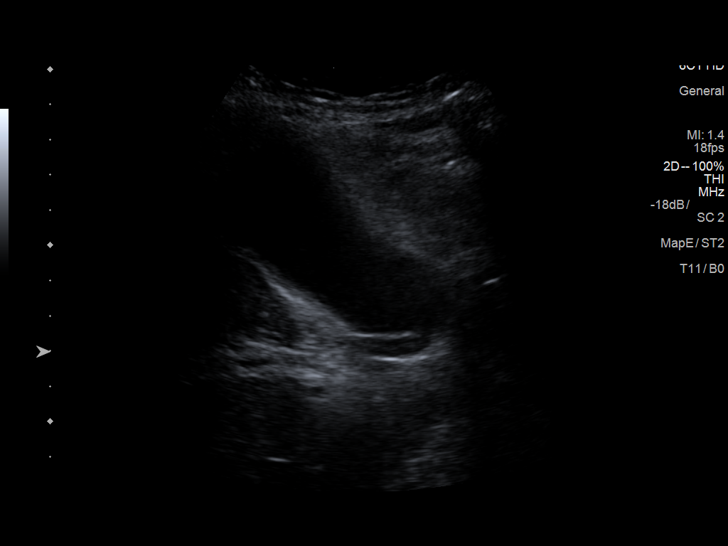
[im 3/28]
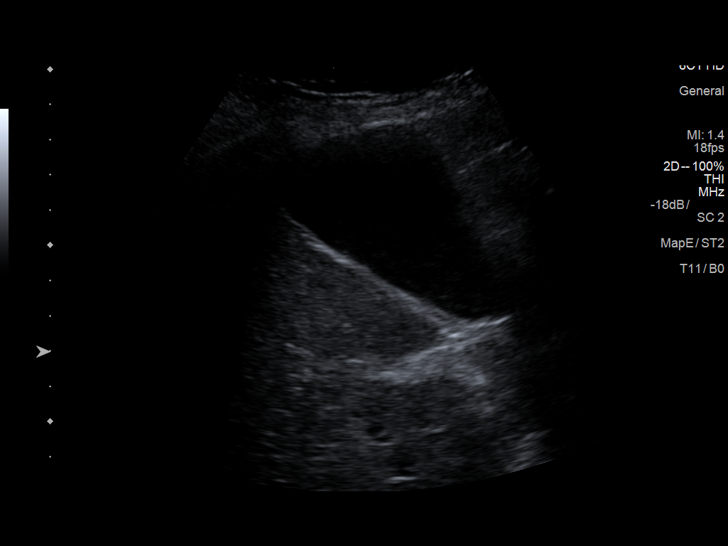
[im 5/28]
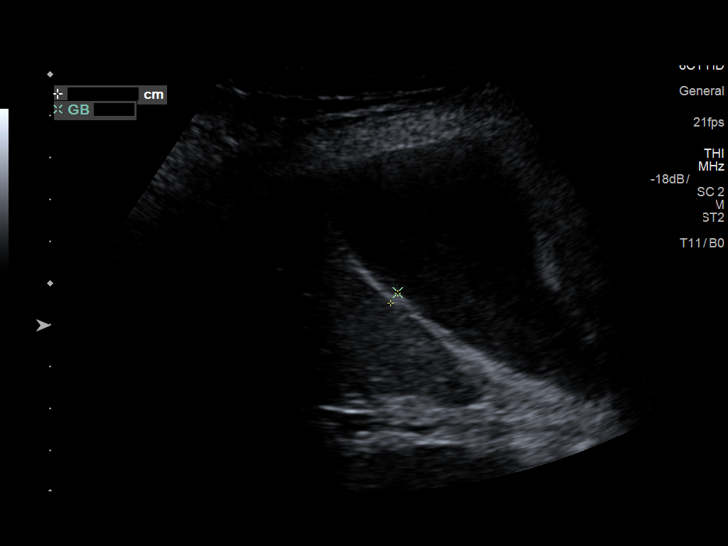
[im 7/28]
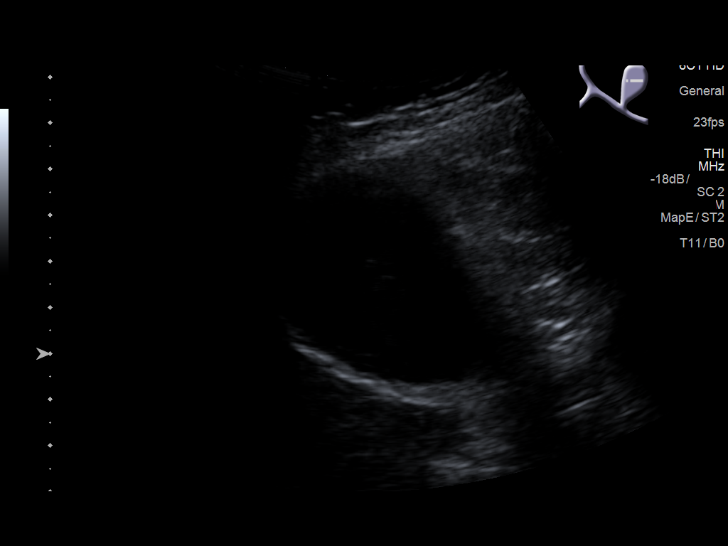
[im 10/28]
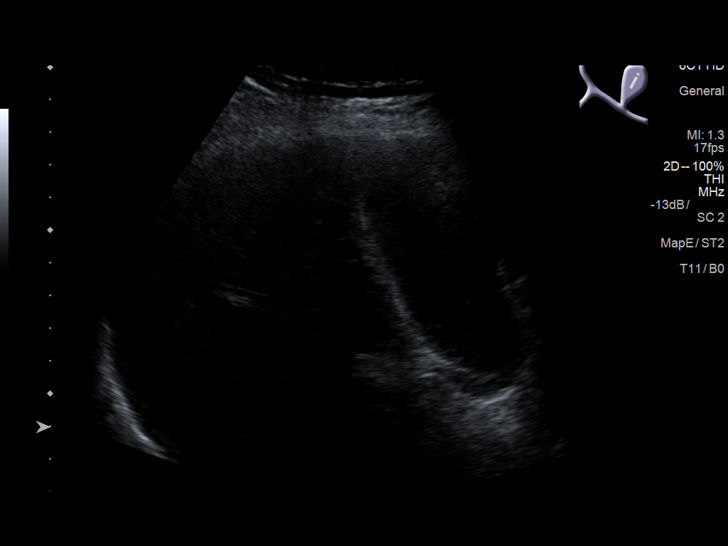
[im 11/28]
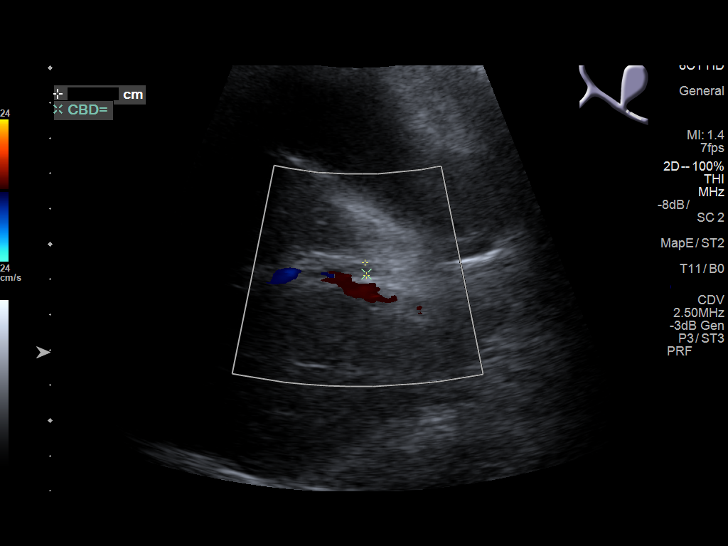
[im 13/28]
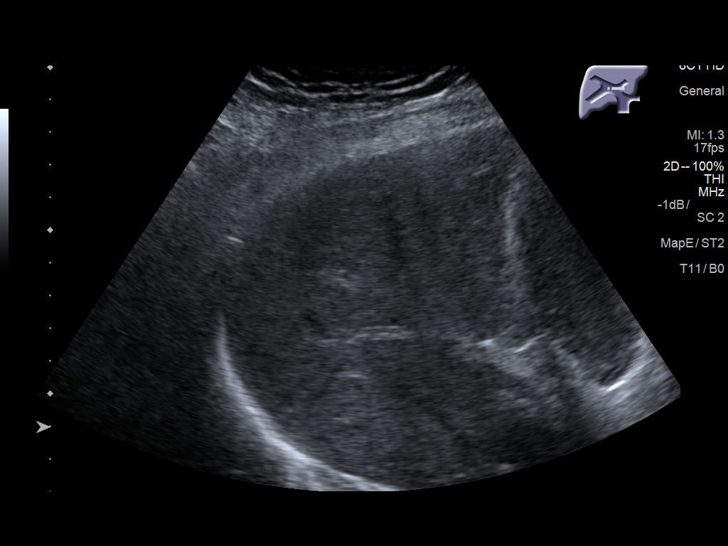
[im 15/28]
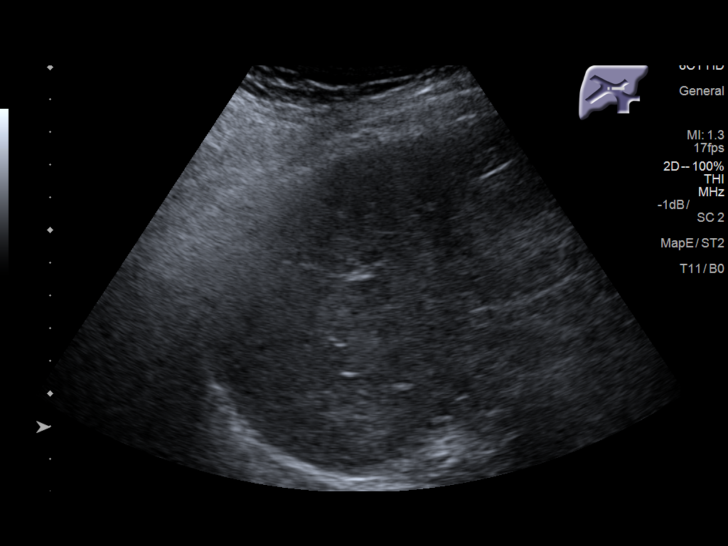
[im 17/28]
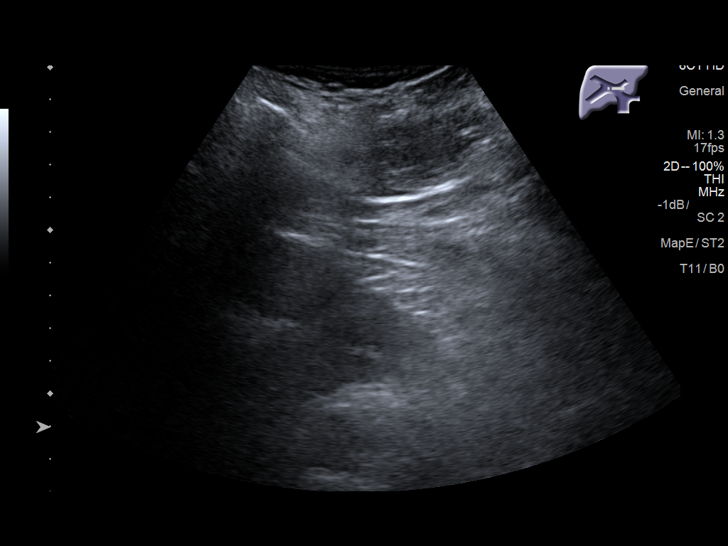
[im 19/28]
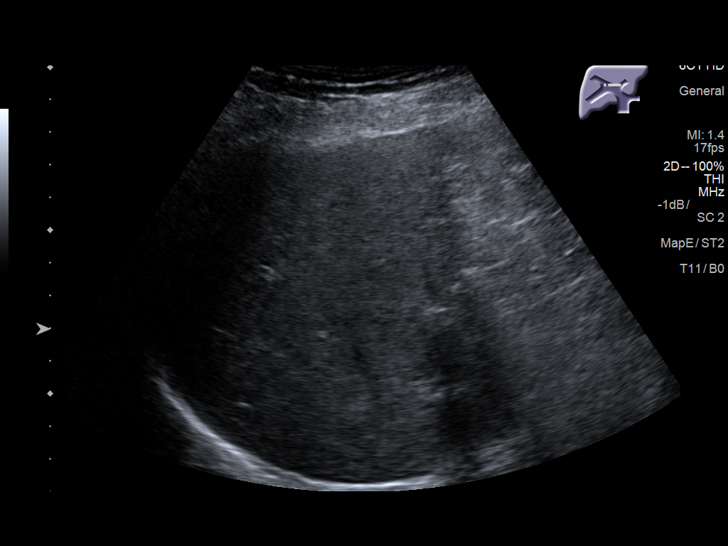
[im 21/28]
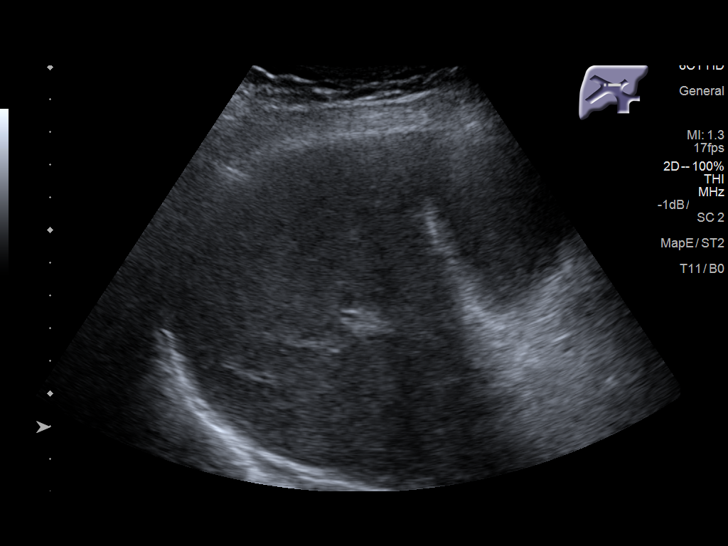
[im 23/28]
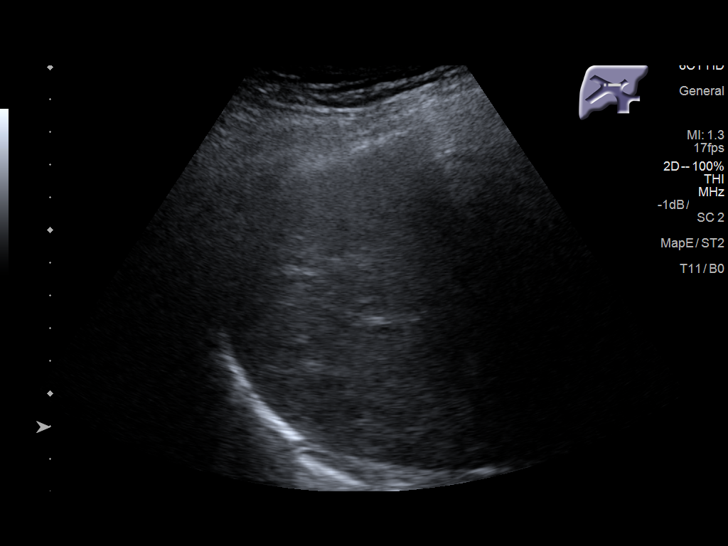
[im 25/28]
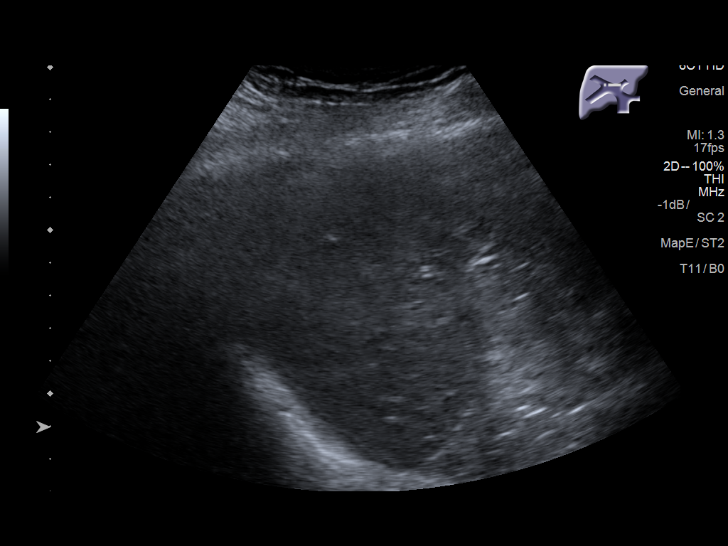
[im 28/28]
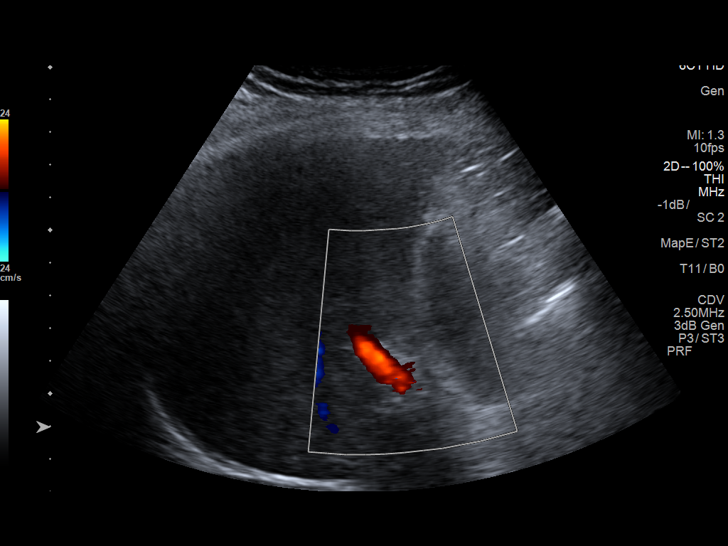

[14 of 25 positions shown; findings below may reference images not displayed]

FINDINGS: Gallbladder:

No gallstones or wall thickening visualized. No sonographic Murphy
sign noted by sonographer.

Common bile duct:

Diameter: 3.6 mm

Liver:

No focal lesion identified. Within normal limits in parenchymal
echogenicity.
IMPRESSION: Normal liver, gallbladder and bile ducts.
